# Patient Record
Sex: Female | Born: 1995 | Race: Black or African American | Hispanic: No | Marital: Single | Smoking: Never smoker
Health system: Southern US, Community
[De-identification: ages and names within clinical notes are randomized; demographics above are authoritative.]

## PROBLEM LIST (undated history)

## (undated) ENCOUNTER — Inpatient Hospital Stay (HOSPITAL_COMMUNITY): Payer: Self-pay

## (undated) DIAGNOSIS — R55 Syncope and collapse: Secondary | ICD-10-CM

## (undated) DIAGNOSIS — Z789 Other specified health status: Secondary | ICD-10-CM

## (undated) DIAGNOSIS — Z8619 Personal history of other infectious and parasitic diseases: Secondary | ICD-10-CM

## (undated) HISTORY — DX: Syncope and collapse: R55

## (undated) HISTORY — PX: INDUCED ABORTION: SHX677

## (undated) HISTORY — PX: NO PAST SURGERIES: SHX2092

## (undated) HISTORY — DX: Personal history of other infectious and parasitic diseases: Z86.19

---
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
## 2014-08-02 ENCOUNTER — Encounter (HOSPITAL_COMMUNITY): Payer: Self-pay

## 2014-08-02 ENCOUNTER — Inpatient Hospital Stay (HOSPITAL_COMMUNITY)
Admission: AD | Admit: 2014-08-02 | Discharge: 2014-08-02 | Disposition: A | Discharge status: Home / Self Care | Payer: Commercial Indemnity | Source: Ambulatory Visit | Attending: Obstetrics & Gynecology | Admitting: Obstetrics & Gynecology

## 2014-08-02 DIAGNOSIS — F17200 Nicotine dependence, unspecified, uncomplicated: Secondary | ICD-10-CM | POA: Diagnosis not present

## 2014-08-02 DIAGNOSIS — R52 Pain, unspecified: Secondary | ICD-10-CM

## 2014-08-02 DIAGNOSIS — R1033 Periumbilical pain: Secondary | ICD-10-CM | POA: Insufficient documentation

## 2014-08-02 DIAGNOSIS — R109 Unspecified abdominal pain: Secondary | ICD-10-CM

## 2014-08-02 LAB — URINE MICROSCOPIC-ADD ON

## 2014-08-02 LAB — URINALYSIS, ROUTINE W REFLEX MICROSCOPIC
Bilirubin Urine: NEGATIVE
GLUCOSE, UA: NEGATIVE mg/dL
KETONES UR: NEGATIVE mg/dL
LEUKOCYTES UA: NEGATIVE
NITRITE: NEGATIVE
PROTEIN: NEGATIVE mg/dL
Specific Gravity, Urine: 1.01 (ref 1.005–1.030)
UROBILINOGEN UA: 4 mg/dL — AB (ref 0.0–1.0)
pH: 7 (ref 5.0–8.0)

## 2014-08-02 LAB — POCT PREGNANCY, URINE: PREG TEST UR: NEGATIVE

## 2014-08-02 NOTE — MAU Provider Note (Signed)
History     CSN: [id number]  Arrival date and time: 08/02/14 2234   First Provider Initiated Contact with Patient 08/02/14 2346      Chief Complaint  Patient presents with  . Abdominal Pain   Abdominal Pain    Sarah Quinn is a 18 y.o. a G1P1001 who presents with periumbilical abdominal pain. She states that she had the pain while she was at work, but it has since gone. She states that she took a benadryl, but has not taken anything else. She refuses any pain medication today. She states that she would like to go home since the pain has subsided.   Past Medical History  Diagnosis Date  . Medical history non-contributory     Past Surgical History  Procedure Laterality Date  . No past surgeries      History reviewed. No pertinent family history.  History  Substance Use Topics  . Smoking status: Current Every Day Smoker -- 1.00 packs/day for 1 years    Types: Cigarettes  . Smokeless tobacco: Never Used  . Alcohol Use: No    Allergies: No Known Allergies  Prescriptions prior to admission  Medication Sig Dispense Refill  . diphenhydrAMINE (BENADRYL) 25 MG tablet Take 25 mg by mouth every 6 (six) hours as needed.        Review of Systems  Gastrointestinal: Positive for abdominal pain.   Physical Exam   Blood pressure 117/67, pulse 64, temperature 99.2 F (37.3 C), temperature source Oral, resp. rate 16, height 5\' 5"  (1.651 m), weight 58.605 kg (129 lb 3.2 oz), last menstrual period 07/20/2014.  Physical Exam  Nursing note and vitals reviewed. Constitutional: She is oriented to person, place, and time. She appears well-developed and well-nourished. No distress.  GI: Soft.  Neurological: She is alert and oriented to person, place, and time.  Skin: Skin is warm and dry.  Psychiatric: She has a normal mood and affect.    MAU Course  Procedures Results for orders placed during the hospital encounter of 08/02/14 (from the past 24 hour(s))  URINALYSIS,  ROUTINE W REFLEX MICROSCOPIC     Status: Abnormal   Collection Time    08/02/14 11:01 PM      Result Value Ref Range   Color, Urine YELLOW  YELLOW   APPearance CLEAR  CLEAR   Specific Gravity, Urine 1.010  1.005 - 1.030   pH 7.0  5.0 - 8.0   Glucose, UA NEGATIVE  NEGATIVE mg/dL   Hgb urine dipstick TRACE (*) NEGATIVE   Bilirubin Urine NEGATIVE  NEGATIVE   Ketones, ur NEGATIVE  NEGATIVE mg/dL   Protein, ur NEGATIVE  NEGATIVE mg/dL   Urobilinogen, UA 4.0 (*) 0.0 - 1.0 mg/dL   Nitrite NEGATIVE  NEGATIVE   Leukocytes, UA NEGATIVE  NEGATIVE  URINE MICROSCOPIC-ADD ON     Status: Abnormal   Collection Time    08/02/14 11:01 PM      Result Value Ref Range   Squamous Epithelial / LPF RARE  RARE   WBC, UA 0-2  <3 WBC/hpf   RBC / HPF 3-6  <3 RBC/hpf   Bacteria, UA FEW (*) RARE  POCT PREGNANCY, URINE     Status: None   Collection Time    08/02/14 11:13 PM      Result Value Ref Range   Preg Test, Ur NEGATIVE  NEGATIVE   Assessment and Plan   1. Abdominal pain, acute    Follow-up Information   [facility] (If symptoms worsen)    Specialty:  Emergency Medicine   Contact information:   [address] [place] [id number] [place] [address] [phone]       Tawnya Crook 08/02/2014, 11:49 PM

## 2014-08-02 NOTE — Discharge Instructions (Signed)

## 2014-08-02 NOTE — MAU Note (Signed)
Lower abdominal cramping since this morning. nexplanon in place since November 2014. Denies vaginal bleeding or vaginal discharge. Denies urinary complaints. Some nausea, no vomiting/diarrhea/constipation.

## 2014-10-30 ENCOUNTER — Encounter (HOSPITAL_COMMUNITY): Payer: Self-pay

## 2015-07-07 ENCOUNTER — Encounter (HOSPITAL_COMMUNITY): Payer: Self-pay

## 2015-07-07 ENCOUNTER — Emergency Department (HOSPITAL_COMMUNITY)
Admission: EM | Admit: 2015-07-07 | Discharge: 2015-07-07 | Disposition: A | Discharge status: Home / Self Care | Payer: Commercial Indemnity | Attending: Emergency Medicine | Admitting: Emergency Medicine

## 2015-07-07 DIAGNOSIS — Z720 Tobacco use: Secondary | ICD-10-CM | POA: Diagnosis not present

## 2015-07-07 DIAGNOSIS — R238 Other skin changes: Secondary | ICD-10-CM

## 2015-07-07 DIAGNOSIS — R21 Rash and other nonspecific skin eruption: Secondary | ICD-10-CM | POA: Diagnosis present

## 2015-07-07 NOTE — ED Notes (Signed)
Pt had her birth control in her arm replaced over 2 years ago but recently noticed a bump that has come up on the inside of her left arm where they placed it and it itches occasionally and the bump swells up from time to time. Wants to get it checked out.

## 2015-07-07 NOTE — Discharge Instructions (Signed)
No signs of infection or allergic reaction on exam today.  It is recommended that you follow up with the physician that implanted the Implanon.  It may need to be removed.  Return to the Emergency Department if you notice redness or warmth at the site, fever above 101 F, or swelling of the lips, tongue, or throat.

## 2015-07-07 NOTE — ED Provider Notes (Signed)
History  This chart was scribed for non-physician practitioner, Santiago Glad, PA-C,working with Lorre Nick, MD, by Karle Plumber, ED Scribe. This patient was seen in room TR07C/TR07C and the patient's care was started at 4:27 PM.  Chief Complaint  Patient presents with  . Rash   The history is provided by the patient and medical records. No language interpreter was used.    HPI Comments:  Sarah Quinn is a 19 y.o. female who presents to the Emergency Department complaining of a nodule to the LUE that appeared earlier today. She states the nodule appeared next to the site where her birth control implant is (placed 2 years ago) and reports associated soreness, itching and swelling of the area. She states she has been having issues around the area intermittently since she received the implant. She has not done anything to treat the symptoms. She denies alleviating factors. She denies fever, chills, red streaking, nausea or vomiting.  Past Medical History  Diagnosis Date  . Medical history non-contributory    Past Surgical History  Procedure Laterality Date  . No past surgeries     No family history on file. History  Substance Use Topics  . Smoking status: Current Every Day Smoker -- 1.00 packs/day for 1 years    Types: Cigarettes  . Smokeless tobacco: Never Used  . Alcohol Use: No   OB History    Gravida Para Term Preterm AB TAB SAB Ectopic Multiple Living   0 0 0 0 0 0 1     Review of Systems  Constitutional: Negative for fever and chills.  Gastrointestinal: Negative for nausea and vomiting.  Skin: Positive for rash (nodule). Negative for color change.    Allergies  Review of patient's allergies indicates no known allergies.  Home Medications   Prior to Admission medications   Medication Sig Start Date End Date Taking? Authorizing Provider  diphenhydrAMINE (BENADRYL) 25 MG tablet Take 25 mg by mouth every 6 (six) hours as needed.    Historical  Provider, MD   Triage Vitals: BP 118/78 mmHg  Pulse 72  Temp(Src) 98.1 F (36.7 C) (Oral)  Resp 14  SpO2 95%  LMP  Physical Exam  Constitutional: She is oriented to person, place, and time. She appears well-developed and well-nourished.  HENT:  Head: Normocephalic and atraumatic.  Eyes: EOM are normal.  Neck: Normal range of motion.  Cardiovascular: Normal rate, regular rhythm and normal heart sounds.  Exam reveals no gallop and no friction rub.   No murmur heard. Pulmonary/Chest: Effort normal and breath sounds normal. No respiratory distress. She has no wheezes. She has no rales.  Musculoskeletal: Normal range of motion.  Neurological: She is alert and oriented to person, place, and time.  Skin: Skin is warm and dry. No erythema.  Small 1-2 mm skin colored raised area to LUE. No surrounding erythema, edema or warmth. No other rash. No signs of infection.  Psychiatric: She has a normal mood and affect. Her behavior is normal.  Nursing note and vitals reviewed.   ED Course  Procedures (including critical care time) DIAGNOSTIC STUDIES: Oxygen Saturation is 95% on RA, adequate by my interpretation.   COORDINATION OF CARE: 4:32 PM- Reassured pt that the area did not appear infected. Encouraged to follow up with the doctor that inserted the Implanon. Pt verbalizes understanding and agrees to plan.  Medications - No data to display  Labs Review Labs Reviewed - No data to display  Imaging Review No results  found.   EKG Interpretation None      MDM   Final diagnoses:  Papule of skin   Patient presents today with a small raised area of the left upper arm at the site of her implanon inplant.  No signs of infection.  No rash present.  Patient instructed to follow up with the physician that implanted the Implanon.  Stable for discharge.  Return precautions given.  I personally performed the services described in this documentation, which was scribed in my presence. The  recorded information has been reviewed and is accurate.    Santiago GladHeather Erdem Naas, PA-C 07/07/15 1926  Lorre NickAnthony Allen, MD 07/08/15 [phone]

## 2016-10-08 ENCOUNTER — Emergency Department (HOSPITAL_COMMUNITY)
Admission: EM | Admit: 2016-10-08 | Discharge: 2016-10-08 | Disposition: A | Discharge status: Home / Self Care | Payer: Commercial Indemnity | Attending: Emergency Medicine | Admitting: Emergency Medicine

## 2016-10-08 ENCOUNTER — Encounter (HOSPITAL_COMMUNITY): Payer: Self-pay

## 2016-10-08 DIAGNOSIS — N920 Excessive and frequent menstruation with regular cycle: Secondary | ICD-10-CM | POA: Diagnosis not present

## 2016-10-08 DIAGNOSIS — N939 Abnormal uterine and vaginal bleeding, unspecified: Secondary | ICD-10-CM | POA: Diagnosis present

## 2016-10-08 DIAGNOSIS — N390 Urinary tract infection, site not specified: Secondary | ICD-10-CM

## 2016-10-08 DIAGNOSIS — F17210 Nicotine dependence, cigarettes, uncomplicated: Secondary | ICD-10-CM | POA: Diagnosis not present

## 2016-10-08 LAB — URINALYSIS, ROUTINE W REFLEX MICROSCOPIC
Bilirubin Urine: NEGATIVE
GLUCOSE, UA: NEGATIVE mg/dL
KETONES UR: NEGATIVE mg/dL
Nitrite: POSITIVE — AB
PH: 7.5 (ref 5.0–8.0)
Protein, ur: NEGATIVE mg/dL
SPECIFIC GRAVITY, URINE: 1.025 (ref 1.005–1.030)

## 2016-10-08 LAB — URINE MICROSCOPIC-ADD ON

## 2016-10-08 LAB — WET PREP, GENITAL
CLUE CELLS WET PREP: NONE SEEN
SPERM: NONE SEEN
TRICH WET PREP: NONE SEEN
Yeast Wet Prep HPF POC: NONE SEEN

## 2016-10-08 LAB — PREGNANCY, URINE: Preg Test, Ur: NEGATIVE

## 2016-10-08 MED ORDER — ACETAMINOPHEN 325 MG PO TABS
650.0000 mg | ORAL_TABLET | Freq: Once | ORAL | Status: AC
  Administered 2016-10-08: 650 mg via ORAL
  Filled 2016-10-08: qty 2

## 2016-10-08 MED ORDER — NITROFURANTOIN MONOHYD MACRO 100 MG PO CAPS: 100.0000 mg | ORAL_CAPSULE | Freq: Two times a day (BID) | ORAL | 0 refills | Status: DC

## 2016-10-08 MED ORDER — KETOROLAC TROMETHAMINE 30 MG/ML IJ SOLN
30.0000 mg | Freq: Once | INTRAMUSCULAR | Status: AC
  Administered 2016-10-08: 30 mg via INTRAMUSCULAR
  Filled 2016-10-08: qty 1

## 2016-10-08 MED ORDER — NAPROXEN 500 MG PO TABS: 500.0000 mg | ORAL_TABLET | Freq: Two times a day (BID) | ORAL | 0 refills | Status: DC

## 2016-10-08 NOTE — ED Triage Notes (Signed)
She states Her period began yesterday and was normal. She states that today her bleeding has become "a lot" and she is having some period cramps. She states she has an Implanon implant that was implanted over three years ago.  She is in no distress.

## 2016-10-08 NOTE — ED Notes (Signed)
No respiratory or acute distress noted alert and oriented x 3 call light in reach. 

## 2016-10-08 NOTE — ED Provider Notes (Signed)
[facility]-EMERGENCY DEPT Provider Note   CSN: [id number] Arrival date & time: 10/08/16  1357     History   Chief Complaint Chief Complaint  Patient presents with  . Vaginal Bleeding    HPI Sarah Quinn is a 20 y.o. female.  HPI Sarah Quinn is a 20 y.o. female presents to ED with abdominal pain and vaginal bleeding. Patient states that she started her menstrual cycle yesterday. She states that it was appropriate time for her to start. It was normal yesterday, however today she developed increased bleeding and cramping. Patient states that she is passing large clots and has to change her pad every half an hour. Patient states that she sat on the toilet and states "blood was just coming out." She does have an Implanon, states she has had it for 3 years, and states that it was supposed to be taken out. She denies any urinary symptoms. Unsure if she is pregnant. Denies any back pain. Denies dizziness or lightheadedness. No other complaints.  Past Medical History:  Diagnosis Date  . Medical history non-contributory     Patient Active Problem List   Diagnosis Date Noted  . SCOLIOSIS 08/31/2007    Past Surgical History:  Procedure Laterality Date  . NO PAST SURGERIES      OB History    Gravida Para Term Preterm AB Living   1 1 1  0 0 1   SAB TAB Ectopic Multiple Live Births   0 0 0 0 1       Home Medications    Prior to Admission medications   Medication Sig Start Date End Date Taking? Authorizing Provider  levonorgestrel (MIRENA) 20 MCG/24HR IUD 1 each by Intrauterine route once. Placed 07/2013   Yes Historical Provider, MD    Family History No family history on file.  Social History Social History  Substance Use Topics  . Smoking status: Current Every Day Smoker    Packs/day: 1.00    Years: 1.00    Types: Cigarettes  . Smokeless tobacco: Never Used  . Alcohol use No     Allergies   Review of patient's allergies indicates no known  allergies.   Review of Systems Review of Systems  Constitutional: Negative for chills and fever.  Respiratory: Negative for cough, chest tightness and shortness of breath.   Cardiovascular: Negative for chest pain, palpitations and leg swelling.  Gastrointestinal: Positive for abdominal pain. Negative for diarrhea, nausea and vomiting.  Genitourinary: Positive for pelvic pain and vaginal bleeding. Negative for dysuria, flank pain, vaginal discharge and vaginal pain.  Musculoskeletal: Negative for arthralgias, myalgias, neck pain and neck stiffness.  Skin: Negative for rash.  Neurological: Negative for dizziness, weakness and headaches.  All other systems reviewed and are negative.    Physical Exam Updated Vital Signs BP 111/81 (BP Location: Right Arm)   Pulse 70   Temp 98.4 F (36.9 C) (Oral)   Resp 18   Ht 5\' 6"  (1.676 m)   Wt 60.2 kg   LMP 10/07/2016 (Exact Date)   SpO2 96%   BMI 21.43 kg/m   Physical Exam  Constitutional: She appears well-developed and well-nourished. No distress.  HENT:  Head: Normocephalic.  Eyes: Conjunctivae are normal.  Neck: Neck supple.  Cardiovascular: Normal rate, regular rhythm and normal heart sounds.   Pulmonary/Chest: Effort normal and breath sounds normal. No respiratory distress. She has no wheezes. She has no rales.  Abdominal: Soft. Bowel sounds are normal. She exhibits no distension. There is tenderness.  There is no rebound.  Suprapubic tenderness  Genitourinary:  Genitourinary Comments: Normal external genitalia. Normal vaginal canal. Moderate blood, no clots in vaginal canal. Cervix is normal, closed. No CMT. No uterine or adnexal tenderness. No masses palpated.    Musculoskeletal: She exhibits no edema.  Neurological: She is alert.  Skin: Skin is warm and dry.  Psychiatric: She has a normal mood and affect. Her behavior is normal.  Nursing note and vitals reviewed.    ED Treatments / Results  Labs (all labs ordered are  listed, but only abnormal results are displayed) Labs Reviewed  WET PREP, GENITAL - Abnormal; Notable for the following:       Result Value   WBC, Wet Prep HPF POC FEW (*)    All other components within normal limits  URINALYSIS, ROUTINE W REFLEX MICROSCOPIC (NOT AT [facility]) - Abnormal; Notable for the following:    APPearance TURBID (*)    Hgb urine dipstick LARGE (*)    Nitrite POSITIVE (*)    Leukocytes, UA SMALL (*)    All other components within normal limits  URINE MICROSCOPIC-ADD ON - Abnormal; Notable for the following:    Squamous Epithelial / LPF 0-5 (*)    Bacteria, UA MANY (*)    All other components within normal limits  PREGNANCY, URINE  GC/CHLAMYDIA PROBE AMP ([place]) NOT AT [facility]    EKG  EKG Interpretation None       Radiology No results found.  Procedures Procedures (including critical care time)  Medications Ordered in ED Medications - No data to display   Initial Impression / Assessment and Plan / ED Course  I have reviewed the triage vital signs and the nursing notes.  Pertinent labs & imaging results that were available during my care of the patient were reviewed by me and considered in my medical decision making (see chart for details).  Clinical Course   Patient in emergency department with vaginal bleeding and lower abdominal cramping. Symptoms started today, bleeding started yesterday. States it is normal., However pain and bleeding is heavier. Patient states her Implanon is expired. She reports similar symptoms prior to getting Implanon. Will check pelvic exam, will check pregnancy test and urinalysis.   7:43 PM Patient feels much better after Toradol and Tylenol. She is not pregnant. Her urinalysis shows many bacteria, will treat with Macrobid for 5 days. Her pelvic exam unremarkable other than some blood noted in vaginal canal, no clots. Cervix is normal. Most likely dysfunctional uterine bleeding from expired Implanon. Will have her follow  up with her OB/GYN. Naproxen for pain and inflammation.  Vital signs are normal at this time of discharge. Return precautions discussed.  Final Clinical Impressions(s) / ED Diagnoses   Final diagnoses:  Menorrhagia with regular cycle  Urinary tract infection without hematuria, site unspecified    New Prescriptions New Prescriptions   NAPROXEN (NAPROSYN) 500 MG TABLET    Take 1 tablet (500 mg total) by mouth 2 (two) times daily.   NITROFURANTOIN, MACROCRYSTAL-MONOHYDRATE, (MACROBID) 100 MG CAPSULE    Take 1 capsule (100 mg total) by mouth 2 (two) times daily.     Jaynie Crumbleatyana Sanna Porcaro, PA-C 10/08/16 1944    Jaynie Crumbleatyana Neno Hohensee, PA-C 10/08/16 1945    Melene Planan Floyd, DO 10/08/16 2002

## 2016-10-08 NOTE — Discharge Instructions (Signed)
Take naproxen as prescribed for pain and to slow down the flow. Try heating pads. You can take Tylenol in addition for pain relief. Please follow up with OB/GYN doctor for renewal of your birth control. Take macrobid for UTI until all gone.

## 2016-10-09 LAB — GC/CHLAMYDIA PROBE AMP (~~LOC~~) NOT AT ARMC
CHLAMYDIA, DNA PROBE: POSITIVE — AB
Neisseria Gonorrhea: POSITIVE — AB

## 2016-10-11 ENCOUNTER — Telehealth (HOSPITAL_BASED_OUTPATIENT_CLINIC_OR_DEPARTMENT_OTHER): Payer: Self-pay

## 2017-01-24 ENCOUNTER — Encounter (HOSPITAL_COMMUNITY): Payer: Self-pay

## 2017-01-24 ENCOUNTER — Emergency Department (HOSPITAL_COMMUNITY)
Admission: EM | Admit: 2017-01-24 | Discharge: 2017-01-24 | Disposition: A | Discharge status: Home / Self Care | Payer: Managed Care, Other (non HMO) | Attending: Emergency Medicine | Admitting: Emergency Medicine

## 2017-01-24 DIAGNOSIS — F17210 Nicotine dependence, cigarettes, uncomplicated: Secondary | ICD-10-CM | POA: Insufficient documentation

## 2017-01-24 DIAGNOSIS — R197 Diarrhea, unspecified: Secondary | ICD-10-CM | POA: Diagnosis not present

## 2017-01-24 DIAGNOSIS — R109 Unspecified abdominal pain: Secondary | ICD-10-CM | POA: Diagnosis present

## 2017-01-24 LAB — URINALYSIS, ROUTINE W REFLEX MICROSCOPIC
Bilirubin Urine: NEGATIVE
GLUCOSE, UA: NEGATIVE mg/dL
Hgb urine dipstick: NEGATIVE
Ketones, ur: NEGATIVE mg/dL
Nitrite: NEGATIVE
Protein, ur: NEGATIVE mg/dL
Specific Gravity, Urine: 1.018 (ref 1.005–1.030)
pH: 6 (ref 5.0–8.0)

## 2017-01-24 LAB — COMPREHENSIVE METABOLIC PANEL
ALK PHOS: 33 U/L — AB (ref 38–126)
ALT: 20 U/L (ref 14–54)
AST: 27 U/L (ref 15–41)
Albumin: 4 g/dL (ref 3.5–5.0)
Anion gap: 9 (ref 5–15)
BUN: 13 mg/dL (ref 6–20)
CALCIUM: 9.1 mg/dL (ref 8.9–10.3)
CO2: 23 mmol/L (ref 22–32)
Chloride: 106 mmol/L (ref 101–111)
Creatinine, Ser: 0.87 mg/dL (ref 0.44–1.00)
GFR calc Af Amer: >60 mL/min (ref >60)
GFR calc non Af Amer: >60 mL/min (ref >60)
Glucose, Bld: 92 mg/dL (ref 65–99)
Potassium: 3.8 mmol/L (ref 3.5–5.1)
SODIUM: 138 mmol/L (ref 135–145)
Total Bilirubin: 0.8 mg/dL (ref 0.3–1.2)
Total Protein: 6.9 g/dL (ref 6.5–8.1)

## 2017-01-24 LAB — CBC
HCT: 43.5 % (ref 36.0–46.0)
HEMOGLOBIN: 14.7 g/dL (ref 12.0–15.0)
MCH: 30.9 pg (ref 26.0–34.0)
MCHC: 33.8 g/dL (ref 30.0–36.0)
MCV: 91.4 fL (ref 78.0–100.0)
Platelets: 252 10*3/uL (ref 150–400)
RBC: 4.76 MIL/uL (ref 3.87–5.11)
RDW: 12.6 % (ref 11.5–15.5)
WBC: 7.4 10*3/uL (ref 4.0–10.5)

## 2017-01-24 LAB — I-STAT BETA HCG BLOOD, ED (MC, WL, AP ONLY): I-stat hCG, quantitative: <5 m[IU]/mL (ref <5)

## 2017-01-24 LAB — LIPASE, BLOOD: Lipase: 16 U/L (ref 11–51)

## 2017-01-24 MED ORDER — SODIUM CHLORIDE 0.9 % IV BOLUS (SEPSIS)
1000.0000 mL | Freq: Once | INTRAVENOUS | Status: AC
  Administered 2017-01-24: 1000 mL via INTRAVENOUS

## 2017-01-24 MED ORDER — ONDANSETRON HCL 4 MG/2ML IJ SOLN
4.0000 mg | Freq: Once | INTRAMUSCULAR | Status: AC
  Administered 2017-01-24: 4 mg via INTRAVENOUS
  Filled 2017-01-24: qty 2

## 2017-01-24 NOTE — ED Notes (Signed)
Pt feels much better and wishes to go home.

## 2017-01-24 NOTE — ED Provider Notes (Signed)
[facility]-EMERGENCY DEPT Provider Note  CSN: [id number] Arrival date & time: 01/24/17  1404  History   Chief Complaint Chief Complaint  Patient presents with  . Abdominal Pain  . Emesis   HPI Sarah Quinn is a 21 y.o. female.  The history is provided by the patient and medical records. No language interpreter was used.  Illness  This is a new problem. The current episode started 2 days ago. The problem occurs constantly. The problem has been gradually worsening. Associated symptoms include abdominal pain (cramping). Pertinent negatives include no chest pain, no headaches and no shortness of breath. The symptoms are aggravated by eating. Relieved by: bowel movements.    Past Medical History:  Diagnosis Date  . Medical history non-contributory    Patient Active Problem List   Diagnosis Date Noted  . SCOLIOSIS 08/31/2007   Past Surgical History:  Procedure Laterality Date  . NO PAST SURGERIES     OB History    Gravida Para Term Preterm AB Living   1 1 1  0 0 1   SAB TAB Ectopic Multiple Live Births   0 0 0 0 1      Home Medications    Prior to Admission medications   Medication Sig Start Date End Date Taking? Authorizing Provider  etonogestrel (NEXPLANON) 68 MG IMPL implant 1 each by Subdermal route once.   Yes Historical Provider, MD   Family History No family history on file.  Social History Social History  Substance Use Topics  . Smoking status: Current Every Day Smoker    Packs/day: 1.00    Years: 1.00    Types: Cigarettes  . Smokeless tobacco: Never Used  . Alcohol use No    Allergies   Patient has no known allergies.  Review of Systems Review of Systems  Constitutional: Positive for appetite change (decreased) and chills.  Respiratory: Negative for cough and shortness of breath.   Cardiovascular: Negative for chest pain.  Gastrointestinal: Positive for abdominal pain (cramping), diarrhea and nausea.  Genitourinary: Negative for dysuria,  frequency, hematuria, urgency, vaginal bleeding and vaginal discharge.  Neurological: Negative for headaches.  All other systems reviewed and are negative.   Physical Exam Updated Vital Signs BP 116/78 (BP Location: Right Arm)   Pulse 92   Temp 98.2 F (36.8 C) (Oral)   Resp 18   Ht 5\' 3"  (1.6 m)   Wt 63.6 kg   SpO2 100%   BMI 24.82 kg/m   Physical Exam  Constitutional: She is oriented to person, place, and time. She appears well-developed and well-nourished. No distress.  HENT:  Head: Normocephalic and atraumatic.  Eyes: EOM are normal. Pupils are equal, round, and reactive to light.  Neck: Normal range of motion. Neck supple.  Cardiovascular: Regular rhythm and normal heart sounds.  Tachycardia present.   Pulmonary/Chest: Effort normal and breath sounds normal.  Abdominal: Soft. Bowel sounds are normal. She exhibits no distension. There is no tenderness.  Musculoskeletal: Normal range of motion.  Neurological: She is alert and oriented to person, place, and time.  Skin: Skin is warm and dry. Capillary refill takes less than 2 seconds. She is not diaphoretic.  Nursing note and vitals reviewed.   ED Treatments / Results  Labs (all labs ordered are listed, but only abnormal results are displayed) Labs Reviewed  COMPREHENSIVE METABOLIC PANEL - Abnormal; Notable for the following:       Result Value   Alkaline Phosphatase 33 (*)    All other components within  normal limits  URINALYSIS, ROUTINE W REFLEX MICROSCOPIC - Abnormal; Notable for the following:    APPearance HAZY (*)    Leukocytes, UA SMALL (*)    Bacteria, UA RARE (*)    Squamous Epithelial / LPF 6-30 (*)    All other components within normal limits  LIPASE, BLOOD  CBC  I-STAT BETA HCG BLOOD, ED ([facility], [facility], [facility] ONLY)   EKG  EKG Interpretation None      Radiology No results found.  Procedures Procedures (including critical care time)  Medications Ordered in ED Medications  sodium chloride 0.9 % bolus  1,000 mL (0 mLs Intravenous Stopped 01/24/17 2027)  ondansetron (ZOFRAN) injection 4 mg (4 mg Intravenous Given 01/24/17 1854)    Initial Impression / Assessment and Plan / ED Course  I have reviewed the triage vital signs and the nursing notes.  21 y.o. female with above stated PMHx, HPI, and physical. Symptom onset over past 24-48 hours. Nausea, decreased appetite, watery nonbloody diarrhea. No suspicious food intake. Patient denies recent travel or antibiotics. Patient is otherwise healthy with no medical problems.  Heart rate and low 100s but normotensive. Urinalysis showing no evidence of infection or severe dehydration. CBC without leukocytosis or acute anemia. CMP and lipase unremarkable. Serum beta-hCG not detectable. Patient given IV fluids and Zofran following which she is feeling better. The source is viral gastroenteritis.  Laboratory and imaging results were personally reviewed by myself and used in the medical decision making of this patient's treatment and disposition.  Pt discharged home in stable condition. Strict ED return precautions dicussed. Pt understands and agrees with the plan and has no further questions or concerns.   Pt care discussed with and followed by my attending, Dr. Gerhard Munch  Angelina Ok, MD Pager [phone]  Final Clinical Impressions(s) / ED Diagnoses   Final diagnoses:  Diarrhea in adult patient   New Prescriptions New Prescriptions   No medications on file     Angelina Ok, MD 01/24/17 9604    Gerhard Munch, MD 01/27/17 1530

## 2017-01-24 NOTE — ED Triage Notes (Signed)
Patient complains of abdominal pain with vomiting and diarrhea that started last night. Has friend with same. denies fever, no chills. Alert and oriented, NAD

## 2017-04-10 ENCOUNTER — Encounter (HOSPITAL_COMMUNITY): Payer: Self-pay | Admitting: *Deleted

## 2017-04-10 ENCOUNTER — Emergency Department (HOSPITAL_COMMUNITY)
Admission: EM | Admit: 2017-04-10 | Discharge: 2017-04-10 | Disposition: A | Discharge status: Home / Self Care | Payer: Managed Care, Other (non HMO) | Attending: Emergency Medicine | Admitting: Emergency Medicine

## 2017-04-10 DIAGNOSIS — F17210 Nicotine dependence, cigarettes, uncomplicated: Secondary | ICD-10-CM | POA: Diagnosis not present

## 2017-04-10 DIAGNOSIS — B009 Herpesviral infection, unspecified: Secondary | ICD-10-CM | POA: Insufficient documentation

## 2017-04-10 DIAGNOSIS — M79602 Pain in left arm: Secondary | ICD-10-CM | POA: Diagnosis present

## 2017-04-10 MED ORDER — TRAMADOL HCL 50 MG PO TABS: 50.0000 mg | ORAL_TABLET | Freq: Four times a day (QID) | ORAL | 0 refills | Status: DC | PRN

## 2017-04-10 MED ORDER — VALACYCLOVIR HCL 1 G PO TABS: 1000.0000 mg | ORAL_TABLET | Freq: Three times a day (TID) | ORAL | 0 refills | Status: DC

## 2017-04-10 NOTE — Discharge Instructions (Signed)
Return here as needed. Follow up with the [facility]

## 2017-04-10 NOTE — ED Provider Notes (Signed)
[facility]-EMERGENCY DEPT Provider Note   CSN: [id number] Arrival date & time: 04/10/17  1926  By signing my name below, I, [name], attest that this documentation has been prepared under the direction and in the presence of [employer], PA-C. Electronically Signed: [name], Scribe. 04/10/2017. 9:39 PM.  History   Chief Complaint Chief Complaint  Patient presents with  . Arm Pain   The history is provided by the patient and medical records. No language interpreter was used.    HPI Comments:  Sarah Quinn is a 21 y.o. female with no pertinent PMHx, who presents to the Emergency Department complaining of intermittent, acutely worsening, itching/ burning in her left arm onset three years ago. Pt reports for three years, she has been having pain/itching and burning in the left arm s/p an Implanon contraceptive device placement. She states upon waking up this morning, the area around the site was red with some swelling and worsened burning and pain beyond baseline. She states she has had a similar problem with the [facility] prior to today, with similar burning three years ago leading to the removal of the contraceptive and reinsertion to the current site. Pt denies rash, CP, back pain, and any other complaints at this time. PT is a current every day smoker.   Past Medical History:  Diagnosis Date  . Medical history non-contributory    Patient Active Problem List   Diagnosis Date Noted  . SCOLIOSIS 08/31/2007   Past Surgical History:  Procedure Laterality Date  . NO PAST SURGERIES      OB History    Gravida Para Term Preterm AB Living   0 0 1   SAB TAB Ectopic Multiple Live Births   0 0 0 0 1      Home Medications    Prior to Admission medications   Medication Sig Start Date End Date Taking? Authorizing Provider  etonogestrel (NEXPLANON) 68 MG IMPL implant 1 each by Subdermal route once.    Historical Provider, MD    Family History No family history  on file.  Social History Social History  Substance Use Topics  . Smoking status: Current Every Day Smoker    Packs/day: 1.00    Years: 1.00    Types: Cigarettes  . Smokeless tobacco: Never Used  . Alcohol use Yes     Allergies   Patient has no known allergies.   Review of Systems Review of Systems All systems reviewed and are negative for acute change except as noted in the HPI.    Physical Exam Updated Vital Signs BP 96/83 (BP Location: Right Arm) Comment (BP Location): refuse left arm use   Pulse 89   Temp 98.3 F (36.8 C) (Oral)   Resp 20   Ht  (1.676 m)   Wt 140 lb (63.5 kg)   SpO2 97%   BMI 22.60 kg/m   Physical Exam  Constitutional: She is oriented to person, place, and time. She appears well-developed and well-nourished. No distress.  HENT:  Head: Normocephalic and atraumatic.  Eyes: Pupils are equal, round, and reactive to light.  Pulmonary/Chest: Effort normal.  Neurological: She is alert and oriented to person, place, and time.  Skin: Skin is warm and dry.  Two vesicles on the medial aspect of the left mid bicep region with surrounding erythema. Palpable pain around the area. Vesicles intact.   Psychiatric: She has a normal mood and affect.  Nursing note and vitals reviewed.  ED Treatments / Results  DIAGNOSTIC STUDIES:  Oxygen Saturation is 100% on RA, normal by my interpretation.    COORDINATION OF CARE:  9:39 PM Discussed treatment plan with pt at bedside including Valtrex and Tramadol and pt agreed to plan.  Labs (all labs ordered are listed, but only abnormal results are displayed) Labs Reviewed - No data to display  EKG  EKG Interpretation None       Radiology No results found.  Procedures Procedures (including critical care time)  Medications Ordered in ED Medications - No data to display   Initial Impression / Assessment and Plan / ED Course  I have reviewed the triage vital signs and the nursing  notes.  Pertinent labs & imaging results that were available during my care of the patient were reviewed by me and considered in my medical decision making (see chart for details).     Patient has what appears to be a herpetic type rash. The area is very small. Will treat with Valtrex  Final Clinical Impressions(s) / ED Diagnoses   Final diagnoses:  None    New Prescriptions New Prescriptions   No medications on file   I personally performed the services described in this documentation, which was scribed in my presence. The recorded information has been reviewed and is accurate.     Charlestine Night, PA-C 04/21/17 4098    Lyndal Pulley, MD 04/21/17 [phone]

## 2017-04-10 NOTE — ED Triage Notes (Signed)
Pt reports for 2 years she has been having pain/itching and burning in the left arm where her Implanon was placed. No meds prior to arrival. Denies swelling.

## 2017-04-21 ENCOUNTER — Telehealth (HOSPITAL_COMMUNITY): Payer: Self-pay | Admitting: Emergency Medicine

## 2017-04-21 NOTE — Telephone Encounter (Signed)
-----   Message from Lyndal Pulley, MD sent at 04/18/2017  2:08 PM EDT ----- Regarding: leftover chart I don't know if this fell out of your inbox somehow. Forde Radon have it done by Thursday when I leave.

## 2017-08-08 ENCOUNTER — Encounter (HOSPITAL_COMMUNITY): Payer: Self-pay | Admitting: Emergency Medicine

## 2017-08-08 ENCOUNTER — Emergency Department (HOSPITAL_COMMUNITY)
Admission: EM | Admit: 2017-08-08 | Discharge: 2017-08-08 | Disposition: A | Discharge status: Home / Self Care | Payer: Managed Care, Other (non HMO) | Attending: Emergency Medicine | Admitting: Emergency Medicine

## 2017-08-08 DIAGNOSIS — F17210 Nicotine dependence, cigarettes, uncomplicated: Secondary | ICD-10-CM | POA: Insufficient documentation

## 2017-08-08 DIAGNOSIS — M545 Low back pain, unspecified: Secondary | ICD-10-CM

## 2017-08-08 DIAGNOSIS — Y92019 Unspecified place in single-family (private) house as the place of occurrence of the external cause: Secondary | ICD-10-CM | POA: Diagnosis not present

## 2017-08-08 DIAGNOSIS — Y999 Unspecified external cause status: Secondary | ICD-10-CM | POA: Diagnosis not present

## 2017-08-08 DIAGNOSIS — Y939 Activity, unspecified: Secondary | ICD-10-CM | POA: Insufficient documentation

## 2017-08-08 MED ORDER — DICLOFENAC SODIUM 75 MG PO TBEC: 75.0000 mg | DELAYED_RELEASE_TABLET | Freq: Two times a day (BID) | ORAL | 0 refills | Status: DC

## 2017-08-08 MED ORDER — METHOCARBAMOL 500 MG PO TABS: 500.0000 mg | ORAL_TABLET | Freq: Two times a day (BID) | ORAL | 0 refills | Status: DC
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
## 2017-08-08 NOTE — Discharge Instructions (Signed)
See your Physicain this week for recheck if pain persist

## 2017-11-10 ENCOUNTER — Other Ambulatory Visit: Payer: Self-pay | Admitting: Obstetrics & Gynecology

## 2017-12-15 ENCOUNTER — Other Ambulatory Visit: Payer: Self-pay

## 2017-12-15 ENCOUNTER — Encounter (HOSPITAL_COMMUNITY): Payer: Self-pay | Admitting: Emergency Medicine

## 2017-12-15 ENCOUNTER — Emergency Department (HOSPITAL_COMMUNITY): Payer: Managed Care, Other (non HMO)

## 2017-12-15 DIAGNOSIS — Z79899 Other long term (current) drug therapy: Secondary | ICD-10-CM | POA: Diagnosis not present

## 2017-12-15 DIAGNOSIS — R5381 Other malaise: Secondary | ICD-10-CM | POA: Insufficient documentation

## 2017-12-15 DIAGNOSIS — R55 Syncope and collapse: Secondary | ICD-10-CM | POA: Diagnosis present

## 2017-12-15 DIAGNOSIS — R5383 Other fatigue: Secondary | ICD-10-CM | POA: Diagnosis not present

## 2017-12-15 DIAGNOSIS — F17210 Nicotine dependence, cigarettes, uncomplicated: Secondary | ICD-10-CM | POA: Diagnosis not present

## 2017-12-15 DIAGNOSIS — N390 Urinary tract infection, site not specified: Secondary | ICD-10-CM | POA: Diagnosis not present

## 2017-12-15 LAB — URINALYSIS, ROUTINE W REFLEX MICROSCOPIC
Bilirubin Urine: NEGATIVE
Glucose, UA: NEGATIVE mg/dL
Ketones, ur: NEGATIVE mg/dL
Leukocytes, UA: NEGATIVE
NITRITE: POSITIVE — AB
Protein, ur: 100 mg/dL — AB
SPECIFIC GRAVITY, URINE: 1.021 (ref 1.005–1.030)
pH: 5 (ref 5.0–8.0)

## 2017-12-15 LAB — I-STAT BETA HCG BLOOD, ED (MC, WL, AP ONLY)

## 2017-12-15 LAB — CBC
HEMATOCRIT: 41.3 % (ref 36.0–46.0)
Hemoglobin: 13.8 g/dL (ref 12.0–15.0)
MCH: 30.9 pg (ref 26.0–34.0)
MCHC: 33.4 g/dL (ref 30.0–36.0)
MCV: 92.4 fL (ref 78.0–100.0)
Platelets: 324 10*3/uL (ref 150–400)
RBC: 4.47 MIL/uL (ref 3.87–5.11)
RDW: 12.8 % (ref 11.5–15.5)
WBC: 9.5 10*3/uL (ref 4.0–10.5)

## 2017-12-15 LAB — BASIC METABOLIC PANEL
Anion gap: 8 (ref 5–15)
BUN: 12 mg/dL (ref 6–20)
CALCIUM: 8.9 mg/dL (ref 8.9–10.3)
CO2: 23 mmol/L (ref 22–32)
Chloride: 106 mmol/L (ref 101–111)
Creatinine, Ser: 1.01 mg/dL — ABNORMAL HIGH (ref 0.44–1.00)
GFR calc Af Amer: >60 mL/min (ref >60)
GLUCOSE: 125 mg/dL — AB (ref 65–99)
POTASSIUM: 3.4 mmol/L — AB (ref 3.5–5.1)
Sodium: 137 mmol/L (ref 135–145)

## 2017-12-15 LAB — CBG MONITORING, ED: GLUCOSE-CAPILLARY: 119 mg/dL — AB (ref 65–99)

## 2017-12-15 MED ORDER — ONDANSETRON 4 MG PO TBDP
4.0000 mg | ORAL_TABLET | Freq: Once | ORAL | Status: AC
  Administered 2017-12-15: 4 mg via ORAL
  Filled 2017-12-15: qty 1

## 2017-12-15 NOTE — ED Triage Notes (Signed)
Pt reports she was getting her hair done and began to "not feel good... Next thing I know they were getting off the ground."  She reports chest left sided shooting chest pain, nausea and dizziness.  Pt states she did feel dehydrated so she drank a gatoraid afterward.  She reports that she was told she hit her head on the counter when she fell, but denies any head pain at this time.  No blood thinners.

## 2017-12-15 NOTE — ED Notes (Signed)
Pt transported to xray 

## 2017-12-16 ENCOUNTER — Other Ambulatory Visit: Payer: Self-pay

## 2017-12-16 ENCOUNTER — Emergency Department (HOSPITAL_COMMUNITY)
Admission: EM | Admit: 2017-12-16 | Discharge: 2017-12-16 | Disposition: A | Discharge status: Home / Self Care | Payer: Managed Care, Other (non HMO) | Attending: Emergency Medicine | Admitting: Emergency Medicine

## 2017-12-16 DIAGNOSIS — N390 Urinary tract infection, site not specified: Secondary | ICD-10-CM

## 2017-12-16 DIAGNOSIS — R5383 Other fatigue: Secondary | ICD-10-CM

## 2017-12-16 DIAGNOSIS — R55 Syncope and collapse: Secondary | ICD-10-CM

## 2017-12-16 DIAGNOSIS — R5381 Other malaise: Secondary | ICD-10-CM

## 2017-12-16 MED ORDER — CEPHALEXIN 500 MG PO CAPS: 1000.0000 mg | ORAL_CAPSULE | Freq: Two times a day (BID) | ORAL | 0 refills | Status: DC

## 2017-12-16 MED ORDER — CEPHALEXIN 250 MG PO CAPS
1000.0000 mg | ORAL_CAPSULE | Freq: Once | ORAL | Status: AC
  Administered 2017-12-16: 1000 mg via ORAL
  Filled 2017-12-16: qty 4

## 2017-12-16 MED ORDER — ONDANSETRON 4 MG PO TBDP: 4.0000 mg | ORAL_TABLET | ORAL | 0 refills | Status: DC | PRN

## 2017-12-16 NOTE — ED Notes (Signed)
ED Provider at bedside. 
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
## 2017-12-16 NOTE — Discharge Instructions (Signed)
1.  Have a recheck with your family doctor within the next 5-7 days.  If you do not have a family doctor, use the referral number in your discharge instructions to find one. 2.  Take antibiotics as prescribed. 3.  Return to the emergency department if you have recurrent symptoms or new or concerning symptoms.

## 2017-12-18 LAB — URINE CULTURE

## 2017-12-19 ENCOUNTER — Telehealth: Payer: Self-pay

## 2017-12-19 NOTE — Telephone Encounter (Signed)
Post ED Visit - Positive Culture Follow-up  Culture report reviewed by antimicrobial stewardship pharmacist:  []  Enzo BiNathan Batchelder, Pharm.D. []  Celedonio MiyamotoJeremy Frens, Pharm.D., BCPS AQ-ID []  Garvin FilaMike Maccia, Pharm.D., BCPS []  Georgina PillionElizabeth Martin, Pharm.D., BCPS []  MidlandMinh Pham, VermontPharm.D., BCPS, AAHIVP []  Estella HuskMichelle Turner, Pharm.D., BCPS, AAHIVP [x]  Lysle Pearlachel Rumbarger, PharmD, BCPS []  Casilda Carlsaylor Stone, PharmD, BCPS []  Pollyann SamplesAndy Johnston, PharmD, BCPS  Positive urine culture Treated with Cephalexin, organism sensitive to the same and no further patient follow-up is required at this time.  Jerry CarasCullom, Nasrin Lanzo Burnett 12/19/2017, 11:45 AM

## 2018-01-16 ENCOUNTER — Encounter (HOSPITAL_COMMUNITY): Payer: Self-pay | Admitting: Emergency Medicine

## 2018-01-16 ENCOUNTER — Ambulatory Visit (HOSPITAL_COMMUNITY)
Admission: EM | Admit: 2018-01-16 | Discharge: 2018-01-16 | Disposition: A | Discharge status: Home / Self Care | Payer: Managed Care, Other (non HMO) | Attending: Family Medicine | Admitting: Family Medicine

## 2018-01-16 DIAGNOSIS — R519 Headache, unspecified: Secondary | ICD-10-CM

## 2018-01-16 DIAGNOSIS — R51 Headache: Secondary | ICD-10-CM

## 2018-01-16 MED ORDER — DICLOFENAC SODIUM 75 MG PO TBEC: 75.0000 mg | DELAYED_RELEASE_TABLET | Freq: Two times a day (BID) | ORAL | 0 refills | Status: DC

## 2018-01-16 NOTE — ED Triage Notes (Signed)
Pt c/o migraine onset last night. Denies nausea, sensitivity to light or sound. Pain in worse in the front. Has not taken anything for pain.

## 2018-01-16 NOTE — Discharge Instructions (Signed)
  Please seek prompt medical care if: You have: A very bad (severe) headache that is not helped by medicine. Trouble walking or weakness in your arms and legs. Clear or bloody fluid coming from your nose or ears. Changes in your seeing (vision). Jerky movements that you cannot control (seizure). You throw up (vomit). Your symptoms get worse. You lose balance. Your speech is slurred. You pass out. You are sleepier and have trouble staying awake. The black centers of your eyes (pupils) change in size.  These symptoms may be an emergency. Do not wait to see if the symptoms will go away. Get medical help right away. Call your local emergency services. Do not drive yourself to the hospital.  

## 2018-01-16 NOTE — ED Provider Notes (Signed)
  [facility]   [id number] 01/16/18 Arrival Time: 1210  ASSESSMENT & PLAN:  1. Nonintractable episodic headache, unspecified headache type     Meds ordered this encounter  Medications  . diclofenac (VOLTAREN) 75 MG EC tablet    Sig: Take 1 tablet (75 mg total) by mouth 2 (two) times daily.    Dispense:  14 tablet    Refill:  0   Ensure rest and fluids. Will return tomorrow if needed, ED if acute exacerbation. Work note given.  Reviewed expectations re: course of current medical issues. Questions answered. Outlined signs and symptoms indicating need for more acute intervention. Patient verbalized understanding. After Visit Summary given.   SUBJECTIVE:  Sarah Quinn is a 22 y.o. female who presents with complaint of a frontal R headache. Episodic. Usually a few times a month in same frontal location. Typically ibuprofen helps but not helping this time. Gradual onset last evening. No head injury. No n/v/photophobia. Ambulatory. No extremity sensation changes or weakness. No specific aggravating or alleviating factors reported. Recent URI that has resolved.  ROS: As per HPI.   OBJECTIVE:  Vitals:   01/16/18 1244  BP: 114/74  Pulse: 78  Temp: 98.7 F (37.1 C)  TempSrc: Oral  SpO2: 99%    General appearance: alert; no distress Eyes: PERRLA; EOMI; conjunctiva normal HENT: normocephalic; atraumatic Neck: supple with FROM Lungs: clear to auscultation bilaterally Heart: regular rate and rhythm Extremities: no edema; symmetrical with no gross deformities Skin: warm and dry Neurologic: CN 2-12 grossly intact; normal gait; normal symmetric reflexes; normal extremity strength and sensation throughout Psychological: alert and cooperative; normal mood and affect  No Known Allergies  Past Medical History:  Diagnosis Date  . Medical history non-contributory    Social History   Socioeconomic History  . Marital status: Single    Spouse name: Not on file  .  Number of children: Not on file  . Years of education: Not on file  . Highest education level: Not on file  Social Needs  . Financial resource strain: Not on file  . Food insecurity - worry: Not on file  . Food insecurity - inability: Not on file  . Transportation needs - medical: Not on file  . Transportation needs - non-medical: Not on file  Occupational History  . Not on file  Tobacco Use  . Smoking status: Never Smoker  . Smokeless tobacco: Never Used  Substance and Sexual Activity  . Alcohol use: No    Frequency: Never  . Drug use: No  . Sexual activity: Yes    Birth control/protection: None  Other Topics Concern  . Not on file  Social History Narrative  . Not on file    Past Surgical History:  Procedure Laterality Date  . NO PAST SURGERIES       Mardella LaymanHagler, Egon Dittus, MD 01/16/18 [phone]

## 2018-02-18 ENCOUNTER — Encounter: Payer: Self-pay | Admitting: Interventional Cardiology

## 2018-02-25 ENCOUNTER — Ambulatory Visit: Payer: Managed Care, Other (non HMO) | Admitting: Interventional Cardiology

## 2018-03-18 ENCOUNTER — Ambulatory Visit: Payer: Managed Care, Other (non HMO) | Admitting: Neurology

## 2018-03-18 ENCOUNTER — Telehealth: Payer: Self-pay | Admitting: *Deleted

## 2018-03-18 NOTE — Telephone Encounter (Signed)
Pt no showed appt on 03/18/2018 @ 2:30.

## 2018-03-23 ENCOUNTER — Encounter: Payer: Self-pay | Admitting: Neurology

## 2018-07-03 ENCOUNTER — Encounter (HOSPITAL_COMMUNITY): Payer: Self-pay | Admitting: Emergency Medicine

## 2018-07-03 ENCOUNTER — Emergency Department (HOSPITAL_COMMUNITY)
Admission: EM | Admit: 2018-07-03 | Discharge: 2018-07-03 | Disposition: A | Discharge status: Home / Self Care | Payer: Managed Care, Other (non HMO) | Attending: Emergency Medicine | Admitting: Emergency Medicine

## 2018-07-03 ENCOUNTER — Other Ambulatory Visit: Payer: Self-pay

## 2018-07-03 DIAGNOSIS — R112 Nausea with vomiting, unspecified: Secondary | ICD-10-CM | POA: Insufficient documentation

## 2018-07-03 DIAGNOSIS — Z79899 Other long term (current) drug therapy: Secondary | ICD-10-CM | POA: Diagnosis not present

## 2018-07-03 DIAGNOSIS — R197 Diarrhea, unspecified: Secondary | ICD-10-CM | POA: Insufficient documentation

## 2018-07-03 LAB — COMPREHENSIVE METABOLIC PANEL
ALK PHOS: 41 U/L (ref 38–126)
ALT: 17 U/L (ref 0–44)
AST: 22 U/L (ref 15–41)
Albumin: 4 g/dL (ref 3.5–5.0)
Anion gap: 9 (ref 5–15)
BUN: 14 mg/dL (ref 6–20)
CALCIUM: 9.1 mg/dL (ref 8.9–10.3)
CO2: 23 mmol/L (ref 22–32)
CREATININE: 1.02 mg/dL — AB (ref 0.44–1.00)
Chloride: 106 mmol/L (ref 98–111)
GFR calc non Af Amer: >60 mL/min (ref >60)
GLUCOSE: 134 mg/dL — AB (ref 70–99)
Potassium: 3.8 mmol/L (ref 3.5–5.1)
SODIUM: 138 mmol/L (ref 135–145)
Total Bilirubin: 0.7 mg/dL (ref 0.3–1.2)
Total Protein: 7 g/dL (ref 6.5–8.1)

## 2018-07-03 LAB — URINALYSIS, ROUTINE W REFLEX MICROSCOPIC
BILIRUBIN URINE: NEGATIVE
Glucose, UA: NEGATIVE mg/dL
Hgb urine dipstick: NEGATIVE
Ketones, ur: NEGATIVE mg/dL
LEUKOCYTES UA: NEGATIVE
NITRITE: NEGATIVE
Protein, ur: NEGATIVE mg/dL
SPECIFIC GRAVITY, URINE: 1.027 (ref 1.005–1.030)
pH: 6 (ref 5.0–8.0)

## 2018-07-03 LAB — CBC
HCT: 45 % (ref 36.0–46.0)
Hemoglobin: 14.7 g/dL (ref 12.0–15.0)
MCH: 30.9 pg (ref 26.0–34.0)
MCHC: 32.7 g/dL (ref 30.0–36.0)
MCV: 94.5 fL (ref 78.0–100.0)
PLATELETS: 325 10*3/uL (ref 150–400)
RBC: 4.76 MIL/uL (ref 3.87–5.11)
RDW: 12.2 % (ref 11.5–15.5)
WBC: 8 10*3/uL (ref 4.0–10.5)

## 2018-07-03 LAB — I-STAT BETA HCG BLOOD, ED (MC, WL, AP ONLY)

## 2018-07-03 LAB — LIPASE, BLOOD: Lipase: 72 U/L — ABNORMAL HIGH (ref 11–51)

## 2018-07-03 MED ORDER — ONDANSETRON 4 MG PO TBDP: 4.0000 mg | ORAL_TABLET | Freq: Three times a day (TID) | ORAL | 0 refills | Status: DC | PRN

## 2018-07-03 MED ORDER — LOPERAMIDE HCL 2 MG PO CAPS
2.0000 mg | ORAL_CAPSULE | Freq: Once | ORAL | Status: AC
  Administered 2018-07-03: 2 mg via ORAL
  Filled 2018-07-03: qty 1

## 2018-07-03 MED ORDER — ONDANSETRON 4 MG PO TBDP
4.0000 mg | ORAL_TABLET | Freq: Once | ORAL | Status: AC
  Administered 2018-07-03: 4 mg via ORAL
  Filled 2018-07-03: qty 1

## 2018-07-03 MED ORDER — LOPERAMIDE HCL 2 MG PO CAPS: 2.0000 mg | ORAL_CAPSULE | Freq: Four times a day (QID) | ORAL | 0 refills | Status: DC | PRN

## 2018-07-03 NOTE — ED Triage Notes (Signed)
Pt states she is unsure if it is related to seafood she at on July 4th but since then she has been unable to tolerate po intake and has been vomiting. No abdominal pain but endorses diarrhea.

## 2018-07-03 NOTE — ED Notes (Signed)
ED Provider at bedside. 

## 2018-07-03 NOTE — Discharge Instructions (Signed)
Take the prescribed medication as directed.  Drink lots of fluids. Follow-up with your primary care doctor. Return to the ED for new or worsening symptoms.

## 2018-07-03 NOTE — ED Provider Notes (Signed)
[facility] EMERGENCY DEPARTMENT Provider Note   CSN: [id number] Arrival date & time: 07/03/18  2053     History   Chief Complaint Chief Complaint  Patient presents with  . Emesis    HPI Sarah Quinn is a 22 y.o. female.  The history is provided by the patient and medical records.  Emesis   Associated symptoms include diarrhea.   22 y.o. F with hx of syncope, hx of STD's, presenting to the ED for vomiting.  States she ate a lot of seafood on 07/01/18 and since then she has been feeling very nauseated.  She reports 4 episodes of non-bloody, non-bilious emesis today as well as 2 episodes of non-bloody diarrhea.  She denies abdominal pain, just feels like "stomach is upset".  No one else that ate similar foods is sick.  States food tasted fine while she was eating it.  No fever/chills.  No prior abdominal surgeries.  No meds taken PTA.  Past Medical History:  Diagnosis Date  . Hx of chlamydia infection   . Hx of gonorrhea   . Medical history non-contributory   . Syncope     Patient Active Problem List   Diagnosis Date Noted  . SCOLIOSIS 08/31/2007    Past Surgical History:  Procedure Laterality Date  . NO PAST SURGERIES       OB History    Gravida  1   Para  1   Term  1   Preterm  0   AB  0   Living  1     SAB  0   TAB  0   Ectopic  0   Multiple  0   Live Births  1            Home Medications    Prior to Admission medications   Medication Sig Start Date End Date Taking? Authorizing Provider  diclofenac (VOLTAREN) 75 MG EC tablet Take 1 tablet (75 mg total) by mouth 2 (two) times daily. 01/16/18   Mardella Layman, MD  etonogestrel (NEXPLANON) 68 MG IMPL implant 1 each by Subdermal route once.    [provider]    Family History No family history on file.  Social History Social History   Tobacco Use  . Smoking status: Never Smoker  . Smokeless tobacco: Never Used  Substance Use Topics  . Alcohol use: No      Frequency: Never  . Drug use: No     Allergies   Patient has no known allergies.   Review of Systems Review of Systems  Gastrointestinal: Positive for diarrhea, nausea and vomiting.  All other systems reviewed and are negative.    Physical Exam Updated Vital Signs BP 104/63   Pulse (!) 55   SpO2 100%   Physical Exam  Constitutional: She is oriented to person, place, and time. She appears well-developed and well-nourished.  HENT:  Head: Normocephalic and atraumatic.  Mouth/Throat: Oropharynx is clear and moist.  Moist mucous membranes  Eyes: Pupils are equal, round, and reactive to light. Conjunctivae and EOM are normal.  Neck: Normal range of motion.  Cardiovascular: Normal rate, regular rhythm and normal heart sounds.  Pulmonary/Chest: Effort normal and breath sounds normal. No stridor. No respiratory distress.  Abdominal: Soft. Bowel sounds are normal. There is no tenderness. There is no rebound.  Musculoskeletal: Normal range of motion.  Neurological: She is alert and oriented to person, place, and time.  Skin: Skin is warm and dry.  Psychiatric:  She has a normal mood and affect.  Nursing note and vitals reviewed.    ED Treatments / Results  Labs (all labs ordered are listed, but only abnormal results are displayed) Labs Reviewed  LIPASE, BLOOD - Abnormal; Notable for the following components:      Result Value   Lipase 72 (*)    All other components within normal limits  COMPREHENSIVE METABOLIC PANEL - Abnormal; Notable for the following components:   Glucose, Bld 134 (*)    Creatinine, Ser 1.02 (*)    All other components within normal limits  URINALYSIS, ROUTINE W REFLEX MICROSCOPIC - Abnormal; Notable for the following components:   APPearance HAZY (*)    All other components within normal limits  CBC  I-STAT BETA HCG BLOOD, ED ([facility], [facility], [facility] ONLY)    EKG None  Radiology No results found.  Procedures Procedures (including critical care  time)  Medications Ordered in ED Medications  ondansetron (ZOFRAN-ODT) disintegrating tablet 4 mg (4 mg Oral Given 07/03/18 2303)  loperamide (IMODIUM) capsule 2 mg (2 mg Oral Given 07/03/18 2304)     Initial Impression / Assessment and Plan / ED Course  I have reviewed the triage vital signs and the nursing notes.  Pertinent labs & imaging results that were available during my care of the patient were reviewed by me and considered in my medical decision making (see chart for details).  22 year old female here with nausea, vomiting, and diarrhea after eating seafood 2 days ago.  She is afebrile and nontoxic.  Abdomen is soft and benign.  She does not appear clinically dehydrated.  Screening labs overall reassuring, lipase mildly elevated at 72 but do not feel this is clinically significant.  Patient given oral meds, will fluid challenge.  Likely can discharge with continued symptom Medicare.  11:50 PM Tolerated oral meds fine here.  No emesis or diarrhea while in ED.  Stable for d/c.  Continued supportive care at home.  Follow-up with PCP.  Discussed plan with patient, she acknowledged understanding and agreed with plan of care.  Return precautions given for new or worsening symptoms.  Final Clinical Impressions(s) / ED Diagnoses   Final diagnoses:  Nausea vomiting and diarrhea    ED Discharge Orders        Ordered    loperamide (IMODIUM) 2 MG capsule  4 times daily PRN     07/03/18 2350    ondansetron (ZOFRAN ODT) 4 MG disintegrating tablet  Every 8 hours PRN     07/03/18 2350       Garlon HatchetSanders, Talik Casique M, PA-C 07/03/18 2358    Donnetta Hutchingook, Brian, MD 07/04/18 [phone]

## 2018-09-01 ENCOUNTER — Ambulatory Visit (INDEPENDENT_AMBULATORY_CARE_PROVIDER_SITE_OTHER): Payer: Self-pay | Admitting: General Practice

## 2018-09-01 ENCOUNTER — Other Ambulatory Visit: Payer: Self-pay | Admitting: Advanced Practice Midwife

## 2018-09-01 ENCOUNTER — Encounter: Payer: Self-pay | Admitting: Family Medicine

## 2018-09-01 DIAGNOSIS — Z3202 Encounter for pregnancy test, result negative: Secondary | ICD-10-CM

## 2018-09-01 DIAGNOSIS — N926 Irregular menstruation, unspecified: Secondary | ICD-10-CM

## 2018-09-01 LAB — POCT PREGNANCY, URINE: PREG TEST UR: NEGATIVE

## 2018-09-01 NOTE — Progress Notes (Signed)
ATTESTATION OF SUPERVISION OF RN: Evaluation and management procedures were performed by the RN under my supervision and collaboration. I have reviewed the nursing note and chart and agree with the management and plan for this patient.  Samantha Weinhold, CNM  

## 2018-09-01 NOTE — Progress Notes (Signed)
Patient presents to office for upt today. UPT -. Patient reports LMP 07/16/18 & is not on birth control. Patient has not taken any pregnancy tests at home. Patient reports GI upset over several weeks. Recommended follow up with a PCP & provided information to [facility]. Patient verbalized understanding & had no questions.

## 2018-11-22 ENCOUNTER — Encounter (HOSPITAL_COMMUNITY): Payer: Self-pay | Admitting: Emergency Medicine

## 2018-11-22 ENCOUNTER — Emergency Department (HOSPITAL_COMMUNITY)
Admission: EM | Admit: 2018-11-22 | Discharge: 2018-11-22 | Disposition: A | Discharge status: Home / Self Care | Payer: Self-pay | Attending: Emergency Medicine | Admitting: Emergency Medicine

## 2018-11-22 DIAGNOSIS — Y939 Activity, unspecified: Secondary | ICD-10-CM | POA: Insufficient documentation

## 2018-11-22 DIAGNOSIS — Y999 Unspecified external cause status: Secondary | ICD-10-CM | POA: Insufficient documentation

## 2018-11-22 DIAGNOSIS — W109XXA Fall (on) (from) unspecified stairs and steps, initial encounter: Secondary | ICD-10-CM | POA: Insufficient documentation

## 2018-11-22 DIAGNOSIS — T148XXA Other injury of unspecified body region, initial encounter: Secondary | ICD-10-CM

## 2018-11-22 DIAGNOSIS — Z79899 Other long term (current) drug therapy: Secondary | ICD-10-CM | POA: Insufficient documentation

## 2018-11-22 DIAGNOSIS — W19XXXA Unspecified fall, initial encounter: Secondary | ICD-10-CM

## 2018-11-22 DIAGNOSIS — S80812A Abrasion, left lower leg, initial encounter: Secondary | ICD-10-CM | POA: Insufficient documentation

## 2018-11-22 DIAGNOSIS — Y929 Unspecified place or not applicable: Secondary | ICD-10-CM | POA: Insufficient documentation

## 2018-11-22 NOTE — ED Provider Notes (Signed)
[facility] EMERGENCY [facility]PARTMENT Provider Note   CSN: [id number] Arrival date & time: 11/22/18  1242   History   Chief Complaint Chief Complaint  Patient presents with  . Fall   HPI Sarah Quinn is a 22 y.o. female.  HPI   22 year old female presents today status post fall.  Patient notes over the weekend she was going downstairs when the person in front of her pulled her down accidentally.  She notes striking the left anterior aspect of her knee and leg.  She notes a superficial abrasion.  She reports she went to work today but was limping and was told she needed to go home.  Patient denies any pain throughout the knee with weightbearing activities only at the proximal tibialis muscle.  She notes pain is worse with dorsiflexion.  She is uncertain when her last tetanus shot was.  Past Medical History:  Diagnosis Date  . Hx of chlamydia infection   . Hx of gonorrhea   . Medical history non-contributory   . Syncope     Patient Active Problem List   Diagnosis Date Noted  . SCOLIOSIS 08/31/2007    Past Surgical History:  Procedure Laterality Date  . NO PAST SURGERIES       OB History    Gravida  1   Para  1   Term  1   Preterm  0   AB  0   Living  1     SAB  0   TAB  0   Ectopic  0   Multiple  0   Live Births  1            Home Medications    Prior to Admission medications   Medication Sig Start Date End Date Taking? Authorizing Provider  diclofenac (VOLTAREN) 75 MG EC tablet Take 1 tablet (75 mg total) by mouth 2 (two) times daily. 01/16/18   Mardella LaymanHagler, Brian, MD  etonogestrel (NEXPLANON) 68 MG IMPL implant 1 each by Subdermal route once.    [provider]  loperamide (IMODIUM) 2 MG capsule Take 1 capsule (2 mg total) by mouth 4 (four) times daily as needed for diarrhea or loose stools. 07/03/18   Garlon HatchetSanders, Lisa M, PA-C  ondansetron (ZOFRAN ODT) 4 MG disintegrating tablet Take 1 tablet (4 mg total) by mouth every 8  (eight) hours as needed for nausea. 07/03/18   Garlon HatchetSanders, Lisa M, PA-C    Family History History reviewed. No pertinent family history.  Social History Social History   Tobacco Use  . Smoking status: Never Smoker  . Smokeless tobacco: Never Used  Substance Use Topics  . Alcohol use: No    Frequency: Never  . Drug use: No     Allergies   Patient has no known allergies.   Review of Systems Review of Systems  All other systems reviewed and are negative.    Physical Exam Updated Vital Signs BP 115/67 (BP Location: Right Arm)   Pulse 77   Temp 98.3 F (36.8 C) (Oral)   Resp 17   LMP 11/21/2018   SpO2 99%   Physical Exam  Constitutional: She is oriented to person, place, and time. She appears well-developed and well-nourished.  HENT:  Head: Normocephalic and atraumatic.  Eyes: Pupils are equal, round, and reactive to light. Conjunctivae are normal. Right eye exhibits no discharge. Left eye exhibits no discharge. No scleral icterus.  Neck: Normal range of motion. No JVD present. No tracheal deviation present.  Pulmonary/Chest: Effort normal. No stridor.  Musculoskeletal:  Superficial abrasion noted over the left proximal tibialis anterior, no surrounding redness, pain with dorsiflexion, no pain with plantar flexion, no pain with weightbearing.  Anterior knee nontender to palpation no laxity no edema- no surrounding redness  Neurological: She is alert and oriented to person, place, and time. Coordination normal.  Psychiatric: She has a normal mood and affect. Her behavior is normal. Judgment and thought content normal.  Nursing note and vitals reviewed.   ED Treatments / Results  Labs (all labs ordered are listed, but only abnormal results are displayed) Labs Reviewed - No data to display  EKG None  Radiology No results found.  Procedures Procedures (including critical care time)  Medications Ordered in ED Medications - No data to display   Initial  Impression / Assessment and Plan / ED Course  I have reviewed the triage vital signs and the nursing notes.  Pertinent labs & imaging results that were available during my care of the patient were reviewed by me and considered in my medical decision making (see chart for details).     Labs:   Imaging:  Consults:  Therapeutics:  Discharge Meds:   Assessment/Plan: 22 year old female presents status post fall.  Patient with superficial abrasion, no bony abnormalities noted.  Patient is uncertain when her last tetanus was, she will not receive one here today, she understands the risks of not receiving tetanus, encouraged her to follow-up her primary care provider.  Strict return precautions given, she verbalized understanding and agreement to today's plan.   Final Clinical Impressions(s) / ED Diagnoses   Final diagnoses:  Fall, initial encounter  Abrasion    ED Discharge Orders    None       Eyvonne Mechanic, PA-C 11/22/18 1325    Doug Sou, MD 11/22/18 [phone]

## 2018-11-22 NOTE — ED Triage Notes (Signed)
Pt states she fell down 8 stairs on Saturday- no LOC did not hit head. Pt complains of on going left knee pain. Pt ambulatory, but limping from pain.

## 2018-11-22 NOTE — Discharge Instructions (Addendum)
Please read attached information.  Please follow-up with the primary care for Tdap vaccination as it is recommended.  Return immediately if develop any new or worsening signs or symptoms.

## 2018-12-23 ENCOUNTER — Emergency Department (HOSPITAL_COMMUNITY)
Admission: EM | Admit: 2018-12-23 | Discharge: 2018-12-24 | Disposition: A | Discharge status: Home / Self Care | Payer: Medicaid Other | Attending: Emergency Medicine | Admitting: Emergency Medicine

## 2018-12-23 ENCOUNTER — Encounter (HOSPITAL_COMMUNITY): Payer: Self-pay

## 2018-12-23 ENCOUNTER — Other Ambulatory Visit: Payer: Self-pay

## 2018-12-23 DIAGNOSIS — O26891 Other specified pregnancy related conditions, first trimester: Secondary | ICD-10-CM | POA: Diagnosis present

## 2018-12-23 DIAGNOSIS — E876 Hypokalemia: Secondary | ICD-10-CM

## 2018-12-23 DIAGNOSIS — Z3A01 Less than 8 weeks gestation of pregnancy: Secondary | ICD-10-CM | POA: Diagnosis not present

## 2018-12-23 DIAGNOSIS — R1032 Left lower quadrant pain: Secondary | ICD-10-CM | POA: Diagnosis not present

## 2018-12-23 LAB — CBC
HCT: 41.5 % (ref 36.0–46.0)
Hemoglobin: 13.9 g/dL (ref 12.0–15.0)
MCH: 30.5 pg (ref 26.0–34.0)
MCHC: 33.5 g/dL (ref 30.0–36.0)
MCV: 91 fL (ref 80.0–100.0)
NRBC: 0 % (ref 0.0–0.2)
Platelets: 374 10*3/uL (ref 150–400)
RBC: 4.56 MIL/uL (ref 3.87–5.11)
RDW: 12.4 % (ref 11.5–15.5)
WBC: 12 10*3/uL — ABNORMAL HIGH (ref 4.0–10.5)

## 2018-12-23 LAB — URINALYSIS, ROUTINE W REFLEX MICROSCOPIC
BILIRUBIN URINE: NEGATIVE
Glucose, UA: NEGATIVE mg/dL
Ketones, ur: 20 mg/dL — AB
Nitrite: NEGATIVE
Protein, ur: NEGATIVE mg/dL
Specific Gravity, Urine: 1.016 (ref 1.005–1.030)
pH: 6 (ref 5.0–8.0)

## 2018-12-23 LAB — COMPREHENSIVE METABOLIC PANEL
ALK PHOS: 39 U/L (ref 38–126)
ALT: 17 U/L (ref 0–44)
AST: 19 U/L (ref 15–41)
Albumin: 3.7 g/dL (ref 3.5–5.0)
Anion gap: 5 (ref 5–15)
BUN: 7 mg/dL (ref 6–20)
CO2: 28 mmol/L (ref 22–32)
CREATININE: 1.03 mg/dL — AB (ref 0.44–1.00)
Calcium: 9.1 mg/dL (ref 8.9–10.3)
Chloride: 104 mmol/L (ref 98–111)
GFR calc Af Amer: >60 mL/min (ref >60)
GFR calc non Af Amer: >60 mL/min (ref >60)
Glucose, Bld: 84 mg/dL (ref 70–99)
Potassium: 3.3 mmol/L — ABNORMAL LOW (ref 3.5–5.1)
Sodium: 137 mmol/L (ref 135–145)
Total Bilirubin: 0.8 mg/dL (ref 0.3–1.2)
Total Protein: 7.1 g/dL (ref 6.5–8.1)

## 2018-12-23 LAB — I-STAT BETA HCG BLOOD, ED (MC, WL, AP ONLY): I-stat hCG, quantitative: 1130 m[IU]/mL — ABNORMAL HIGH (ref <5)

## 2018-12-23 LAB — LIPASE, BLOOD: Lipase: 20 U/L (ref 11–51)

## 2018-12-23 NOTE — ED Triage Notes (Signed)
Pt here for abdominal pain and took pregnancy test and it was positive.  Pain in central abdomen and goes to bilateral flanks. A&Ox4.

## 2018-12-24 ENCOUNTER — Emergency Department (HOSPITAL_COMMUNITY): Payer: Medicaid Other

## 2018-12-24 LAB — HCG, QUANTITATIVE, PREGNANCY: hCG, Beta Chain, Quant, S: 1273 m[IU]/mL — ABNORMAL HIGH (ref <5)

## 2018-12-24 LAB — ABO/RH: ABO/RH(D): O POS

## 2018-12-24 MED ORDER — PRENATAL COMPLETE 14-0.4 MG PO TABS: 2.0000 | ORAL_TABLET | Freq: Every day | ORAL | 0 refills | Status: AC

## 2018-12-24 MED ORDER — POTASSIUM CHLORIDE CRYS ER 20 MEQ PO TBCR: 40.0000 meq | EXTENDED_RELEASE_TABLET | Freq: Once | ORAL | Status: DC

## 2018-12-24 MED ORDER — ACETAMINOPHEN 500 MG PO TABS
1000.0000 mg | ORAL_TABLET | Freq: Once | ORAL | Status: AC
  Administered 2018-12-24: 1000 mg via ORAL
  Filled 2018-12-24: qty 2

## 2018-12-24 NOTE — ED Provider Notes (Signed)
[facility] EMERGENCY DEPARTMENT Provider Note   CSN: [id number] Arrival date & time: 12/23/18  1839     History   Chief Complaint Chief Complaint  Patient presents with  . Abdominal Pain  . Possible Pregnancy    HPI Sarah Quinn is a 22 y.o. female G18P0101 with a hx of  presents to the Emergency Department complaining of gradual, persistent, progressively worsening left lower quadrant abdominal pain onset several hours prior to arrival.  Patient reports she took a pregnancy test at home x3 and became upset.  She reports after this her abdominal pain started.  She reports she is sexually active with a female partner but is not currently using any contraception.  Patient denies vaginal bleeding.  Last menstrual cycle was November.  Patient denies fever, chills, headache, neck pain, chest pain, shortness of breath, nausea, vomiting, diarrhea, weakness, dizziness, syncope, dysuria, hematuria.  Aggravating or alleviating factors.  No treatments prior to arrival  The history is provided by the patient and medical records. No language interpreter was used.    Past Medical History:  Diagnosis Date  . Hx of chlamydia infection   . Hx of gonorrhea   . Medical history non-contributory   . Syncope     Patient Active Problem List   Diagnosis Date Noted  . SCOLIOSIS 08/31/2007    Past Surgical History:  Procedure Laterality Date  . NO PAST SURGERIES       OB History    Gravida  1   Para  1   Term  1   Preterm  0   AB  0   Living  1     SAB  0   TAB  0   Ectopic  0   Multiple  0   Live Births  1            Home Medications    Prior to Admission medications   Medication Sig Start Date End Date Taking? Authorizing Provider  diclofenac (VOLTAREN) 75 MG EC tablet Take 1 tablet (75 mg total) by mouth 2 (two) times daily. Patient not taking: Reported on 12/24/2018 01/16/18   Mardella Layman, MD  loperamide (IMODIUM) 2 MG capsule Take 1  capsule (2 mg total) by mouth 4 (four) times daily as needed for diarrhea or loose stools. Patient not taking: Reported on 12/24/2018 07/03/18   Garlon Hatchet, PA-C  ondansetron (ZOFRAN ODT) 4 MG disintegrating tablet Take 1 tablet (4 mg total) by mouth every 8 (eight) hours as needed for nausea. Patient not taking: Reported on 12/24/2018 07/03/18   Garlon Hatchet, PA-C  Prenatal Vit-Fe Fumarate-FA (PRENATAL COMPLETE) 14-0.4 MG TABS Take 2 tablets by mouth daily. 12/24/18   Jennie Hannay, Dahlia Client, PA-C    Family History History reviewed. No pertinent family history.  Social History Social History   Tobacco Use  . Smoking status: Never Smoker  . Smokeless tobacco: Never Used  Substance Use Topics  . Alcohol use: No    Frequency: Never  . Drug use: No     Allergies   Patient has no known allergies.   Review of Systems Review of Systems  Constitutional: Negative for appetite change, diaphoresis, fatigue, fever and unexpected weight change.  HENT: Negative for mouth sores.   Eyes: Negative for visual disturbance.  Respiratory: Negative for cough, chest tightness, shortness of breath and wheezing.   Cardiovascular: Negative for chest pain.  Gastrointestinal: Positive for abdominal pain. Negative for constipation, diarrhea, nausea and vomiting.  Endocrine: Negative for polydipsia, polyphagia and polyuria.  Genitourinary: Negative for dysuria, frequency, hematuria and urgency.  Musculoskeletal: Negative for back pain and neck stiffness.  Skin: Negative for rash.  Allergic/Immunologic: Negative for immunocompromised state.  Neurological: Negative for syncope, light-headedness and headaches.  Hematological: Does not bruise/bleed easily.  Psychiatric/Behavioral: Negative for sleep disturbance. The patient is not nervous/anxious.      Physical Exam Updated Vital Signs BP 114/82 (BP Location: Right Arm)   Pulse 85   Temp 98.9 F (37.2 C) (Oral)   Resp 16   SpO2 98%   Physical  Exam Vitals signs and nursing note reviewed.  Constitutional:      General: She is not in acute distress.    Appearance: She is well-developed. She is not diaphoretic.     Comments: Awake, alert, nontoxic appearance  HENT:     Head: Normocephalic and atraumatic.     Mouth/Throat:     Pharynx: No oropharyngeal exudate.  Eyes:     General: No scleral icterus.    Conjunctiva/sclera: Conjunctivae normal.  Neck:     Musculoskeletal: Normal range of motion and neck supple.  Cardiovascular:     Rate and Rhythm: Normal rate and regular rhythm.  Pulmonary:     Effort: Pulmonary effort is normal. No respiratory distress.     Breath sounds: Normal breath sounds. No wheezing.  Abdominal:     General: Bowel sounds are normal.     Palpations: Abdomen is soft. There is no mass.     Tenderness: There is abdominal tenderness in the left lower quadrant. There is no right CVA tenderness, left CVA tenderness, guarding or rebound.  Musculoskeletal: Normal range of motion.  Skin:    General: Skin is warm and dry.  Neurological:     Mental Status: She is alert.     Comments: Speech is clear and goal oriented Moves extremities without ataxia      ED Treatments / Results  Labs (all labs ordered are listed, but only abnormal results are displayed) Labs Reviewed  COMPREHENSIVE METABOLIC PANEL - Abnormal; Notable for the following components:      Result Value   Potassium 3.3 (*)    Creatinine, Ser 1.03 (*)    All other components within normal limits  CBC - Abnormal; Notable for the following components:   WBC 12.0 (*)    All other components within normal limits  URINALYSIS, ROUTINE W REFLEX MICROSCOPIC - Abnormal; Notable for the following components:   Hgb urine dipstick SMALL (*)    Ketones, ur 20 (*)    Leukocytes, UA TRACE (*)    Bacteria, UA MANY (*)    All other components within normal limits  HCG, QUANTITATIVE, PREGNANCY - Abnormal; Notable for the following components:   hCG,  Beta Chain, Quant, S 1,273 (*)    All other components within normal limits  I-STAT BETA HCG BLOOD, ED ([facility], [facility], [facility] ONLY) - Abnormal; Notable for the following components:   I-stat hCG, quantitative 1,130.0 (*)    All other components within normal limits  URINE CULTURE  LIPASE, BLOOD  ABO/RH     Radiology US Ob Comp < 14 Wks  Result Date: 12/24/2018 CLINICAL DATA:  Acute onset of generalized abdominal pain. EXAM: OBSTETRIC <14 WK ULTRASOUND TECHNIQUE: Transabdominal ultrasound was performed for evaluation of the gestation as well as the maternal uterus and adnexal regions. COMPARISON:  Pelvic ultrasound performed 12/06/2009 FINDINGS: Intrauterine gestational sac: Single; visualized and grossly normal in shape. Yolk sac:  No Embryo:  No MSD: 3.2  mm   5 w   0  d Subchorionic hemorrhage:  None visualized. Maternal uterus/adnexae: There is a 1.4 cm cystic structure at the left ovary, with surrounding soft tissue density and peripherally increased blood flow. A similar left ovarian lesion was noted on the ultrasound from September 2010, and this is thought to more likely reflect a corpus luteal cyst or chronic cystic lesion, though ectopic pregnancy cannot be entirely excluded. The right ovary is not visualized on this study. Trace free fluid is noted at the right adnexa. IMPRESSION: 1. Suspect single intrauterine gestational sac, measuring 3 mm in mean sac diameter, corresponding to a gestational age of [redacted] weeks 0 days. This matches the gestational age of [redacted] weeks 0 days by LMP, reflecting an estimated date of delivery of August 19, 2019. No yolk sac or embryo is yet seen. 2. 1.4 cm cystic structure at the left ovary with surrounding blood flow and soft tissue density is thought to reflect a corpus luteal cyst or chronic cystic lesion. Ectopic pregnancy is considered less likely. The patient's quantitative beta HCG of 1,273 seems too low for the size of cystic structure to reflect an ectopic. Follow-up  pelvic ultrasound is recommended in 1-2 weeks if the quantitative HCG continues to trend upward, to exclude ectopic pregnancy and confirm that the intrauterine gestational sac continues to grow. These results were called by telephone at the time of interpretation on 12/24/2018 at 2:30 am to [facility] Nichola Cieslinski PA, who verbally acknowledged these results. Electronically Signed   By: Roanna RaiderJeffery  Chang M.D.   On: 12/24/2018 02:34    Procedures Procedures (including critical care time)  Medications Ordered in ED Medications  potassium chloride SA (K-DUR,KLOR-CON) CR tablet 40 mEq (has no administration in time range)  acetaminophen (TYLENOL) tablet 1,000 mg (1,000 mg Oral Given 12/24/18 0101)     Initial Impression / Assessment and Plan / ED Course  I have reviewed the triage vital signs and the nursing notes.  Pertinent labs & imaging results that were available during my care of the patient were reviewed by me and considered in my medical decision making (see chart for details).     Presents with left lower quadrant abdominal pain after finding out she was pregnant.  Labs reassuring.  hCG 1200.  Urinalysis without evidence of urinary tract infection.  Mild hypokalemia.  Potassium given here in the emergency department.  Mild leukocytosis.  On arrival, patient tachycardic however no persistent tachycardia here in the emergency department.  Ultrasound with IUP and questionable cystic structure on the left ovary.  Images were discussed with the radiologist who feels this is most likely a corpus luteum.  He recommends follow-up ultrasound in 1 to 2 weeks.  Long discussion with patient about today's results.  Her abdominal pain has resolved completely after Tylenol administration.  Repeat exam is soft and nontender.  Pt remains without vaginal bleeding. I discussed with patient the likely corpus luteum cyst on her left ovary however cannot 100% rule out ectopic or heterotopic pregnancy.  Patient will  need 48-hour follow-up for hCG trending.  She may potentially need an additional ultrasound.  Patient is to present to the MAU sooner than 48 hours if her pain returns or she develops vaginal bleeding.  Patient states understanding and is in agreement with this plan.  BP 114/82 (BP Location: Right Arm)   Pulse 85   Temp 98.9 F (37.2 C) (Oral)   Resp 16  SpO2 98%    Final Clinical Impressions(s) / ED Diagnoses   Final diagnoses:  Less than [redacted] weeks gestation of pregnancy  Left lower quadrant abdominal pain    ED Discharge Orders         Ordered    Prenatal Vit-Fe Fumarate-FA (PRENATAL COMPLETE) 14-0.4 MG TABS  Daily     12/24/18 0308           Windsor Goeken, Dahlia ClientHannah, PA-C 12/24/18 [id number]    Shaune PollackIsaacs, Cameron, MD 12/24/18 1932

## 2018-12-24 NOTE — Discharge Instructions (Addendum)
1. Medications: prenatal vitamin, usual home medications 2. Treatment: rest, drink plenty of fluids, advance diet slowly 3. Follow Up: Please followup with your OB/GYN or MAU in 48 hours for repeat blood work and potentially repeat ultrasound as needed; Please return to the ER/MAU for persistent vomiting, high fevers, development of vaginal bleeding, worsening pain or worsening symptoms

## 2018-12-24 NOTE — ED Notes (Signed)
Pt discharged from ED; instructions provided and scripts given; Pt encouraged to return to ED if symptoms worsen and to f/u with PCP; Pt verbalized understanding of all instructions 

## 2018-12-26 ENCOUNTER — Inpatient Hospital Stay (HOSPITAL_COMMUNITY)
Admission: AD | Admit: 2018-12-26 | Discharge: 2018-12-26 | Disposition: A | Discharge status: Home / Self Care | Payer: Medicaid Other | Source: Ambulatory Visit | Attending: Obstetrics and Gynecology | Admitting: Obstetrics and Gynecology

## 2018-12-26 ENCOUNTER — Encounter: Payer: Self-pay | Admitting: Student

## 2018-12-26 DIAGNOSIS — O26891 Other specified pregnancy related conditions, first trimester: Secondary | ICD-10-CM

## 2018-12-26 DIAGNOSIS — R109 Unspecified abdominal pain: Secondary | ICD-10-CM

## 2018-12-26 DIAGNOSIS — Z3491 Encounter for supervision of normal pregnancy, unspecified, first trimester: Secondary | ICD-10-CM | POA: Diagnosis present

## 2018-12-26 LAB — HCG, QUANTITATIVE, PREGNANCY: hCG, Beta Chain, Quant, S: 3290 m[IU]/mL — ABNORMAL HIGH

## 2018-12-26 LAB — URINE CULTURE: Culture: >=100000 — AB

## 2018-12-26 NOTE — MAU Note (Signed)
Here for follow up HCG   No pain or bleeding 

## 2018-12-26 NOTE — MAU Provider Note (Signed)
Ms. Sarah Quinn  is a 22 y.o. G2P1001  at 5443w0d who presents to MAU today for follow-up quant hCG. The patient denies abdominal pain, vaginal bleeding, N/V or fever.   BP 101/74 (BP Location: Right Arm)   Pulse (!) 104   Temp 98.3 F (36.8 C) (Oral)   Resp 16   Wt 66.7 kg   LMP 11/21/2018   BMI 23.73 kg/m   GENERAL: Well-developed, well-nourished female in no acute distress.  HEENT: Normocephalic, atraumatic.   LUNGS: Effort normal HEART: Regular rate  SKIN: Warm, dry and without erythema PSYCH: Normal mood and affect   A: Appropriate rise in quant hCG after 48 hours Component     Latest Ref Rng & Units 12/24/2018 12/26/2018  HCG, Beta Chain, Quant, S     <5 mIU/mL 1,273 (H) 3,290 (H)    P: Discharge home Bleeding/Ectopic precautions discussed Follow-up US ordered for 1 week Patient may return to MAU as needed or if her condition were to change or worsen   Judeth HornLawrence, Mayjor Ager, NP  12/26/2018 1:58 PM

## 2018-12-26 NOTE — Discharge Instructions (Signed)
Return to care  If you have heavier bleeding that soaks through more that 2 pads per hour for an hour or more If you bleed so much that you feel like you might pass out or you do pass out If you have significant abdominal pain that is not improved with Tylenol   

## 2018-12-27 ENCOUNTER — Telehealth: Payer: Self-pay | Admitting: Emergency Medicine

## 2018-12-27 NOTE — Progress Notes (Signed)
ED Antimicrobial Stewardship Positive Culture Follow Up   Sarah Quinn is an 22 y.o. female who [facility] with a chief complaint of  Chief Complaint  Patient presents with  . Abdominal Pain  . Possible Pregnancy    Recent Results (from the past 720 hour(s))  Urine culture     Status: Abnormal   Collection Time: 12/23/18 11:45 PM  Result Value Ref Range Status   Specimen Description URINE, CLEAN CATCH  Final   Special Requests   Final    NONE Performed at [facility], [address] [place], [place] [address]    Culture >=100,000 COLONIES/mL ESCHERICHIA COLI (A)  Final   Report Status 12/26/2018 FINAL  Final   Organism ID, Bacteria ESCHERICHIA COLI (A)  Final      Susceptibility   Escherichia coli - MIC*    AMPICILLIN <=2 SENSITIVE Sensitive     CEFAZOLIN <=4 SENSITIVE Sensitive     CEFTRIAXONE <=1 SENSITIVE Sensitive     CIPROFLOXACIN <=0.25 SENSITIVE Sensitive     GENTAMICIN 2 SENSITIVE Sensitive     IMIPENEM <=0.25 SENSITIVE Sensitive     NITROFURANTOIN 32 SENSITIVE Sensitive     TRIMETH/SULFA <=20 SENSITIVE Sensitive     AMPICILLIN/SULBACTAM <=2 SENSITIVE Sensitive     PIP/TAZO <=4 SENSITIVE Sensitive     Extended ESBL NEGATIVE Sensitive     * >=100,000 COLONIES/mL ESCHERICHIA COLI    []  Treated with N/A, organism resistant to prescribed antimicrobial [x]  Patient discharged originally without antimicrobial agent and treatment is now indicated  New antibiotic prescription: amoxicillin 500mg  PO TID x 5 days  ED Provider: Burna FortsJeff Hedges, PA   Aleiah Mohammed, Drake Leachachel Lynn 12/27/2018, 8:31 AM Clinical Pharmacist Monday - Friday phone -  [phone] Saturday - Sunday phone - [phone]

## 2018-12-27 NOTE — Telephone Encounter (Signed)
Post ED Visit - Positive Culture Follow-up: Successful Patient Follow-Up  Culture assessed and recommendations reviewed by:  []  Enzo BiNathan Batchelder, Pharm.D. []  Celedonio MiyamotoJeremy Frens, Pharm.D., BCPS AQ-ID [x]  Garvin FilaMike Maccia, Pharm.D., BCPS []  Georgina PillionElizabeth Martin, Pharm.D., BCPS []  HamtramckMinh Pham, [address] BoulevardPharm.D., BCPS, AAHIVP []  Estella HuskMichelle Turner, Pharm.D., BCPS, AAHIVP []  Lysle Pearlachel Rumbarger, PharmD, BCPS []  Phillips Climeshuy Dang, PharmD, BCPS []  Agapito GamesAlison Masters, PharmD, BCPS []  Verlan FriendsErin Deja, PharmD  Positive urine culture  [x]  Patient discharged without antimicrobial prescription and treatment is now indicated []  Organism is resistant to prescribed ED discharge antimicrobial []  Patient with positive blood cultures  Changes discussed with ED provider: Eyvonne MechanicJeffrey Hedges PA New antibiotic prescription start Amoxicillin 500mg  po q 8 hours x 5 days  Attempting to contact patient   Berle MullMiller, Azile Minardi 12/27/2018, 11:00 AM

## 2018-12-29 NOTE — L&D Delivery Note (Signed)
Delivery Note At 9:42 AM a viable female was delivered via Vaginal, Spontaneous (Presentation: OA;  ).  APGAR: 8, 9; weight  pending Placenta status: to pathology.  Cord:  with the following complications: .  Cord pH: n/a  Anesthesia:  epidural Episiotomy: None Lacerations: None;Periurethral Suture Repair: vicryl 4-0 Est. Blood Loss (mL): 13  Mom to postpartum.  Baby to Couplet care / Skin to Skin.  Annalee Genta 08/21/2019, 9:58 AM

## 2019-01-04 ENCOUNTER — Encounter: Payer: Self-pay | Admitting: Family Medicine

## 2019-01-04 ENCOUNTER — Ambulatory Visit (HOSPITAL_COMMUNITY)
Admission: RE | Admit: 2019-01-04 | Discharge: 2019-01-04 | Disposition: A | Discharge status: Home / Self Care | Payer: Medicaid Other | Source: Ambulatory Visit | Attending: Student | Admitting: Student

## 2019-01-04 ENCOUNTER — Ambulatory Visit (INDEPENDENT_AMBULATORY_CARE_PROVIDER_SITE_OTHER): Payer: Medicaid Other

## 2019-01-04 DIAGNOSIS — O26891 Other specified pregnancy related conditions, first trimester: Secondary | ICD-10-CM | POA: Insufficient documentation

## 2019-01-04 DIAGNOSIS — R109 Unspecified abdominal pain: Secondary | ICD-10-CM | POA: Insufficient documentation

## 2019-01-04 DIAGNOSIS — Z712 Person consulting for explanation of examination or test findings: Secondary | ICD-10-CM

## 2019-01-04 NOTE — Progress Notes (Signed)
Pt here today for OB US results.  Results confirm viable pregnancy.  Medications reconciled.  Proof of pregnancy letter provided by the front office to start prenatal care.

## 2019-01-07 NOTE — Progress Notes (Signed)
Chart reviewed for nurse visit. Agree with plan of care.   Marylene Land, CNM 01/07/2019 8:24 PM

## 2019-01-27 ENCOUNTER — Inpatient Hospital Stay (HOSPITAL_COMMUNITY)
Admission: AD | Admit: 2019-01-27 | Discharge: 2019-01-27 | Disposition: A | Discharge status: Home / Self Care | Payer: Medicaid Other | Attending: Obstetrics & Gynecology | Admitting: Obstetrics & Gynecology

## 2019-01-27 ENCOUNTER — Other Ambulatory Visit: Payer: Self-pay

## 2019-01-27 ENCOUNTER — Encounter (HOSPITAL_COMMUNITY): Payer: Self-pay | Admitting: *Deleted

## 2019-01-27 DIAGNOSIS — O219 Vomiting of pregnancy, unspecified: Secondary | ICD-10-CM | POA: Diagnosis not present

## 2019-01-27 DIAGNOSIS — Z3A09 9 weeks gestation of pregnancy: Secondary | ICD-10-CM | POA: Diagnosis not present

## 2019-01-27 DIAGNOSIS — O210 Mild hyperemesis gravidarum: Secondary | ICD-10-CM | POA: Insufficient documentation

## 2019-01-27 LAB — URINALYSIS, ROUTINE W REFLEX MICROSCOPIC
Bilirubin Urine: NEGATIVE
GLUCOSE, UA: NEGATIVE mg/dL
Ketones, ur: NEGATIVE mg/dL
Nitrite: POSITIVE — AB
Protein, ur: NEGATIVE mg/dL
Specific Gravity, Urine: 1.02 (ref 1.005–1.030)
pH: 7 (ref 5.0–8.0)

## 2019-01-27 LAB — URINALYSIS, MICROSCOPIC (REFLEX)

## 2019-01-27 MED ORDER — ONDANSETRON 4 MG PO TBDP
4.0000 mg | ORAL_TABLET | Freq: Once | ORAL | Status: AC
  Administered 2019-01-27: 4 mg via ORAL
  Filled 2019-01-27: qty 1

## 2019-01-27 MED ORDER — DOXYLAMINE-PYRIDOXINE ER 20-20 MG PO TBCR: 1.0000 | EXTENDED_RELEASE_TABLET | Freq: Two times a day (BID) | ORAL | 0 refills | Status: DC

## 2019-01-27 NOTE — MAU Provider Note (Signed)
History     CSN: [id number]  Arrival date and time: 01/27/19 2863   First Provider Initiated Contact with Patient 01/27/19 1025      Chief Complaint  Patient presents with  . Emesis  . Headache   HPI Sarah Quinn is a 23 y.o. G2P1001 at [redacted]w[redacted]d who presents to MAU with chief complaint of nausea and vomiting. She endorses "nonstop" vomiting for the past 24 hours. She denies abdominal pain or tenderness, vaginal bleeding, SOB, lightheadedness, fever or recent illness.  Patient verbalizes she was prescribed an antiemetic but has not picked up the prescription yet.  OB History    Gravida  2   Para  1   Term  1   Preterm  0   AB  0   Living  1     SAB  0   TAB  0   Ectopic  0   Multiple  0   Live Births  1           Past Medical History:  Diagnosis Date  . Hx of chlamydia infection   . Hx of gonorrhea   . Medical history non-contributory   . Syncope     Past Surgical History:  Procedure Laterality Date  . NO PAST SURGERIES      History reviewed. No pertinent family history.  Social History   Tobacco Use  . Smoking status: Never Smoker  . Smokeless tobacco: Never Used  Substance Use Topics  . Alcohol use: No    Frequency: Never  . Drug use: No    Allergies: No Known Allergies  Medications Prior to Admission  Medication Sig Dispense Refill Last Dose  . Prenatal Vit-Fe Fumarate-FA (PRENATAL COMPLETE) 14-0.4 MG TABS Take 2 tablets by mouth daily. 60 each 0     Review of Systems  Constitutional: Negative for chills, fatigue and fever.  Respiratory: Negative for shortness of breath.   Gastrointestinal: Positive for nausea and vomiting. Negative for abdominal pain.  Genitourinary: Negative for difficulty urinating, flank pain, vaginal bleeding, vaginal discharge and vaginal pain.  Musculoskeletal: Negative for back pain.  Neurological: Negative for dizziness, syncope, weakness, light-headedness and headaches.  All other systems reviewed  and are negative.  Physical Exam   Blood pressure 105/71, pulse 78, temperature 98 F (36.7 C), temperature source Oral, resp. rate 16, weight 66.2 kg, last menstrual period 11/21/2018, SpO2 99 %.  Physical Exam  Nursing note and vitals reviewed. Constitutional: She is oriented to person, place, and time. She appears well-developed and well-nourished.  Cardiovascular: Normal rate.  Respiratory: Effort normal.  GI: Soft. Bowel sounds are normal. She exhibits no distension. There is no abdominal tenderness. There is no rebound, no guarding and no CVA tenderness.  Genitourinary:    Genitourinary Comments: Not evaluated based on patient complaint   Neurological: She is alert and oriented to person, place, and time.  Skin: Skin is warm and dry.  Psychiatric: She has a normal mood and affect. Her behavior is normal. Judgment and thought content normal.    MAU Course/MDM  Procedures  --Patient endorses rx for antiemetic but her MyChart does not show as currently prescribed and she did not recognize any medication names for common-prescribed antiemetics --FHT 150 with bedside ultrasound. Patient informed that the ultrasound is considered a limited obstetric ultrasound and is not intended to be a complete ultrasound exam.  Patient also informed that the ultrasound is not being completed with the intent of assessing for fetal or placental anomalies  or any pelvic abnormalities.  Explained that the purpose of today's ultrasound is to assess for fetal heart rate.  Patient acknowledges the purpose of the exam and the limitations of the study.   --Positive nitrites, elevated leukocytes and WBCs. Urine culture ordered  Patient Vitals for the past 24 hrs:  BP Temp Temp src Pulse Resp SpO2 Weight  01/27/19 0937 105/71 98 F (36.7 C) Oral 78 16 99 % 66.2 kg    Results for orders placed or performed during the hospital encounter of 01/27/19 (from the past 24 hour(s))  Urinalysis, Routine w reflex  microscopic     Status: Abnormal   Collection Time: 01/27/19  9:37 AM  Result Value Ref Range   Color, Urine YELLOW YELLOW   APPearance CLEAR CLEAR   Specific Gravity, Urine 1.020 1.005 - 1.030   pH 7.0 5.0 - 8.0   Glucose, UA NEGATIVE NEGATIVE mg/dL   Hgb urine dipstick TRACE (A) NEGATIVE   Bilirubin Urine NEGATIVE NEGATIVE   Ketones, ur NEGATIVE NEGATIVE mg/dL   Protein, ur NEGATIVE NEGATIVE mg/dL   Nitrite POSITIVE (A) NEGATIVE   Leukocytes, UA MODERATE (A) NEGATIVE  Urinalysis, Microscopic (reflex)     Status: Abnormal   Collection Time: 01/27/19  9:37 AM  Result Value Ref Range   RBC / HPF 0-5 0 - 5 RBC/hpf   WBC, UA 11-20 0 - 5 WBC/hpf   Bacteria, UA MANY (A) NONE SEEN   Squamous Epithelial / LPF 0-5 0 - 5    Meds ordered this encounter  Medications  . ondansetron (ZOFRAN-ODT) disintegrating tablet 4 mg  . Doxylamine-Pyridoxine ER (BONJESTA) 20-20 MG TBCR    Sig: Take 1 tablet by mouth 2 (two) times daily at 8 am and 10 pm.    Dispense:  60 tablet    Refill:  0    Order Specific Question:   Supervising Provider    Answer:   Reva Bores [[id number]]   Assessment and Plan  --23 y.o. G2P1001  --N/v in first trimester. Tolerating PO prior to discharge --Rx Bonjesta to pharmacy --Discharge home in stable condition  Calvert Cantor, [place] 01/27/2019, 10:33 AM

## 2019-01-27 NOTE — MAU Note (Addendum)
Keep throwing up.  Can't keep nothing down, it's making her head hurt. Not on any meds, got bad a wk ago.

## 2019-01-27 NOTE — MAU Note (Signed)
Pt states that she was given nausea meds last visit but has not picked them up yet

## 2019-01-27 NOTE — MAU Note (Signed)
Urine in lab 

## 2019-01-27 NOTE — Discharge Instructions (Signed)
Morning Sickness    Morning sickness is when you feel sick to your stomach (nauseous) during pregnancy. You may feel sick to your stomach and throw up (vomit). You may feel sick in the morning, but you can feel this way at any time of day. Some women feel very sick to their stomach and cannot stop throwing up (hyperemesis gravidarum).  Follow these instructions at home:  Medicines   Take over-the-counter and prescription medicines only as told by your doctor. Do not take any medicines until you talk with your doctor about them first.   Taking multivitamins before getting pregnant can stop or lessen the harshness of morning sickness.  Eating and drinking   Eat dry toast or crackers before getting out of bed.   Eat 5 or 6 small meals a day.   Eat dry and bland foods like rice and baked potatoes.   Do not eat greasy, fatty, or spicy foods.   Have someone cook for you if the smell of food causes you to feel sick or throw up.   If you feel sick to your stomach after taking prenatal vitamins, take them at night or with a snack.   Eat protein when you need a snack. Nuts, yogurt, and cheese are good choices.   Drink fluids throughout the day.   Try ginger ale made with real ginger, ginger tea made from fresh grated ginger, or ginger candies.  General instructions   Do not use any products that have nicotine or tobacco in them, such as cigarettes and e-cigarettes. If you need help quitting, ask your doctor.   Use an air purifier to keep the air in your house free of smells.   Get lots of fresh air.   Try to avoid smells that make you feel sick.   Try:  ? Wearing a bracelet that is used for seasickness (acupressure wristband).  ? Going to a doctor who puts thin needles into certain body points (acupuncture) to improve how you feel.  Contact a doctor if:   You need medicine to feel better.   You feel dizzy or light-headed.   You are losing weight.  Get help right away if:   You feel very sick to your  stomach and cannot stop throwing up.   You pass out (faint).   You have very bad pain in your belly.  Summary   Morning sickness is when you feel sick to your stomach (nauseous) during pregnancy.   You may feel sick in the morning, but you can feel this way at any time of day.   Making some changes to what you eat may help your symptoms go away.  This information is not intended to replace advice given to you by your health care provider. Make sure you discuss any questions you have with your health care provider.  Document Released: 01/22/2005 Document Revised: 01/15/2017 Document Reviewed: 01/15/2017  Elsevier Interactive Patient Education  2019 [employer]

## 2019-01-29 ENCOUNTER — Other Ambulatory Visit: Payer: Self-pay | Admitting: Advanced Practice Midwife

## 2019-01-29 LAB — CULTURE, OB URINE: Culture: >=100000 — AB

## 2019-01-29 MED ORDER — CEPHALEXIN 500 MG PO CAPS: 500.0000 mg | ORAL_CAPSULE | Freq: Four times a day (QID) | ORAL | 2 refills | Status: DC

## 2019-01-29 NOTE — Progress Notes (Signed)
Rx Keflex based on urine culture results from MAU visit 01/27/2019. Will notify patient of results via active MyChart.  Clayton Bibles, CNM 01/29/19 10:16 AM

## 2019-03-15 ENCOUNTER — Other Ambulatory Visit: Payer: Self-pay

## 2019-03-15 ENCOUNTER — Encounter (HOSPITAL_COMMUNITY): Payer: Self-pay | Admitting: *Deleted

## 2019-03-15 ENCOUNTER — Inpatient Hospital Stay (HOSPITAL_COMMUNITY)
Admission: AD | Admit: 2019-03-15 | Discharge: 2019-03-15 | Disposition: A | Discharge status: Home / Self Care | Payer: Medicaid Other | Attending: Obstetrics and Gynecology | Admitting: Obstetrics and Gynecology

## 2019-03-15 DIAGNOSIS — Z79899 Other long term (current) drug therapy: Secondary | ICD-10-CM | POA: Diagnosis not present

## 2019-03-15 DIAGNOSIS — Z3A16 16 weeks gestation of pregnancy: Secondary | ICD-10-CM

## 2019-03-15 DIAGNOSIS — O219 Vomiting of pregnancy, unspecified: Secondary | ICD-10-CM | POA: Diagnosis not present

## 2019-03-15 LAB — URINALYSIS, ROUTINE W REFLEX MICROSCOPIC
BILIRUBIN URINE: NEGATIVE
Glucose, UA: NEGATIVE mg/dL
Hgb urine dipstick: NEGATIVE
Ketones, ur: 80 mg/dL — AB
Nitrite: NEGATIVE
Protein, ur: NEGATIVE mg/dL
SPECIFIC GRAVITY, URINE: 1.027 (ref 1.005–1.030)
pH: 5 (ref 5.0–8.0)

## 2019-03-15 MED ORDER — LACTATED RINGERS IV BOLUS
1000.0000 mL | Freq: Once | INTRAVENOUS | Status: AC
  Administered 2019-03-15: 1000 mL via INTRAVENOUS

## 2019-03-15 MED ORDER — PROMETHAZINE HCL 25 MG PO TABS: 12.5000 mg | ORAL_TABLET | Freq: Four times a day (QID) | ORAL | 0 refills | Status: DC | PRN

## 2019-03-15 MED ORDER — SODIUM CHLORIDE 0.9 % IV SOLN
8.0000 mg | Freq: Once | INTRAVENOUS | Status: AC
  Administered 2019-03-15: 8 mg via INTRAVENOUS
  Filled 2019-03-15: qty 4

## 2019-03-15 NOTE — MAU Provider Note (Signed)
History     CSN: [id number]  Arrival date and time: 03/15/19 1149   First Provider Initiated Contact with Patient 03/15/19 1302      Chief Complaint  Patient presents with  . Emesis  . Nausea   Sarah Quinn is a 23 y.o. G2P1001 at [redacted]w[redacted]d who presents today with nausea and vomiting. She states that she was using zofran, and it was helping. However it was causing severe constipation. She last took zofran about one week ago. Next visit 03/17/19  Emesis   This is a new problem. The current episode started in the past 7 days. The problem occurs 5 to 10 times per day. The emesis has an appearance of stomach contents. There has been no fever. Associated symptoms include diarrhea. Pertinent negatives include no chills or fever. Risk factors: pregnancy  Treatments tried: zofran  The treatment provided mild relief.    OB History    Gravida  2   Para  1   Term  1   Preterm  0   AB  0   Living  1     SAB  0   TAB  0   Ectopic  0   Multiple  0   Live Births  1           Past Medical History:  Diagnosis Date  . Hx of chlamydia infection   . Hx of gonorrhea   . Medical history non-contributory   . Syncope     Past Surgical History:  Procedure Laterality Date  . NO PAST SURGERIES      Family History  Problem Relation Age of Onset  . Healthy Mother   . Healthy Father     Social History   Tobacco Use  . Smoking status: Never Smoker  . Smokeless tobacco: Never Used  Substance Use Topics  . Alcohol use: No    Frequency: Never  . Drug use: Yes    Types: Marijuana    Comment: last smoked January 2020    Allergies: No Known Allergies  Medications Prior to Admission  Medication Sig Dispense Refill Last Dose  . cephALEXin (KEFLEX) 500 MG capsule Take 1 capsule (500 mg total) by mouth 4 (four) times daily. 28 capsule 2   . Doxylamine-Pyridoxine ER (BONJESTA) 20-20 MG TBCR Take 1 tablet by mouth 2 (two) times daily at 8 am and 10 pm. 60 tablet 0   .  Prenatal Vit-Fe Fumarate-FA (PRENATAL COMPLETE) 14-0.4 MG TABS Take 2 tablets by mouth daily. 60 each 0     Review of Systems  Constitutional: Negative for chills and fever.  Gastrointestinal: Positive for diarrhea and vomiting. Negative for constipation.  Genitourinary: Negative for dysuria, frequency, pelvic pain, vaginal bleeding and vaginal discharge.   Physical Exam   Blood pressure 105/70, pulse 93, temperature 98.3 F (36.8 C), temperature source Oral, resp. rate 19, height 5\' 6"  (1.676 m), weight 65.4 kg, last menstrual period 11/21/2018, SpO2 98 %.  Physical Exam  Nursing note and vitals reviewed. Constitutional: She is oriented to person, place, and time. She appears well-developed and well-nourished. No distress.  HENT:  Head: Normocephalic.  Cardiovascular: Normal rate.  Respiratory: Effort normal.  GI: Soft. There is no abdominal tenderness. There is no rebound.  Neurological: She is alert and oriented to person, place, and time.  Skin: Skin is warm and dry.  Psychiatric: She has a normal mood and affect.   FHT 141 with doppler    Results for orders placed  or performed during the hospital encounter of 03/15/19 (from the past 24 hour(s))  Urinalysis, Routine w reflex microscopic     Status: Abnormal   Collection Time: 03/15/19 12:16 PM  Result Value Ref Range   Color, Urine YELLOW YELLOW   APPearance HAZY (A) CLEAR   Specific Gravity, Urine 1.027 1.005 - 1.030   pH 5.0 5.0 - 8.0   Glucose, UA NEGATIVE NEGATIVE mg/dL   Hgb urine dipstick NEGATIVE NEGATIVE   Bilirubin Urine NEGATIVE NEGATIVE   Ketones, ur 80 (A) NEGATIVE mg/dL   Protein, ur NEGATIVE NEGATIVE mg/dL   Nitrite NEGATIVE NEGATIVE   Leukocytes,Ua SMALL (A) NEGATIVE   RBC / HPF 0-5 0 - 5 RBC/hpf   WBC, UA 11-20 0 - 5 WBC/hpf   Bacteria, UA FEW (A) NONE SEEN   Squamous Epithelial / LPF 11-20 0 - 5   Mucus PRESENT     MAU Course  Procedures  MDM Patient has had 2L of LR and 8mg  Zofran. IV. She  reports that she is feeling better. She is tolerating PO at this time.   Assessment and Plan   1. Nausea/vomiting in pregnancy   2. [redacted] weeks gestation of pregnancy    DC home Comfort measures reviewed  2nd Trimester precautions  RX: phenergan PRN #[id number]  Return to MAU as needed FU with OB as planned  Follow-up Information    Gynecology, [facility].   Specialty:  Obstetrics and Gynecology Contact information: [address] [place] [address] [phone]            Thressa Sheller DNP, CNM  03/15/19  1:19 PM

## 2019-03-15 NOTE — Discharge Instructions (Signed)
Morning Sickness    Morning sickness is when you feel sick to your stomach (nauseous) during pregnancy. You may feel sick to your stomach and throw up (vomit). You may feel sick in the morning, but you can feel this way at any time of day. Some women feel very sick to their stomach and cannot stop throwing up (hyperemesis gravidarum).  Follow these instructions at home:  Medicines   Take over-the-counter and prescription medicines only as told by your doctor. Do not take any medicines until you talk with your doctor about them first.   Taking multivitamins before getting pregnant can stop or lessen the harshness of morning sickness.  Eating and drinking   Eat dry toast or crackers before getting out of bed.   Eat 5 or 6 small meals a day.   Eat dry and bland foods like rice and baked potatoes.   Do not eat greasy, fatty, or spicy foods.   Have someone cook for you if the smell of food causes you to feel sick or throw up.   If you feel sick to your stomach after taking prenatal vitamins, take them at night or with a snack.   Eat protein when you need a snack. Nuts, yogurt, and cheese are good choices.   Drink fluids throughout the day.   Try ginger ale made with real ginger, ginger tea made from fresh grated ginger, or ginger candies.  General instructions   Do not use any products that have nicotine or tobacco in them, such as cigarettes and e-cigarettes. If you need help quitting, ask your doctor.   Use an air purifier to keep the air in your house free of smells.   Get lots of fresh air.   Try to avoid smells that make you feel sick.   Try:  ? Wearing a bracelet that is used for seasickness (acupressure wristband).  ? Going to a doctor who puts thin needles into certain body points (acupuncture) to improve how you feel.  Contact a doctor if:   You need medicine to feel better.   You feel dizzy or light-headed.   You are losing weight.  Get help right away if:   You feel very sick to your  stomach and cannot stop throwing up.   You pass out (faint).   You have very bad pain in your belly.  Summary   Morning sickness is when you feel sick to your stomach (nauseous) during pregnancy.   You may feel sick in the morning, but you can feel this way at any time of day.   Making some changes to what you eat may help your symptoms go away.  This information is not intended to replace advice given to you by your health care provider. Make sure you discuss any questions you have with your health care provider.  Document Released: 01/22/2005 Document Revised: 01/15/2017 Document Reviewed: 01/15/2017  Elsevier Interactive Patient Education  2019 [employer]

## 2019-03-15 NOTE — MAU Note (Signed)
Presents with c/o N/V, reports can't keep anything down.  States no appetite.  States has vomited 5x in 24 hours. Denies LOF or VB.

## 2019-04-01 ENCOUNTER — Other Ambulatory Visit: Payer: Self-pay

## 2019-04-01 ENCOUNTER — Encounter (HOSPITAL_COMMUNITY): Payer: Self-pay | Admitting: *Deleted

## 2019-04-01 ENCOUNTER — Inpatient Hospital Stay (HOSPITAL_COMMUNITY)
Admission: AD | Admit: 2019-04-01 | Discharge: 2019-04-01 | Disposition: A | Discharge status: Home / Self Care | Payer: Medicaid Other | Attending: Obstetrics & Gynecology | Admitting: Obstetrics & Gynecology

## 2019-04-01 DIAGNOSIS — M79604 Pain in right leg: Secondary | ICD-10-CM

## 2019-04-01 DIAGNOSIS — R102 Pelvic and perineal pain: Secondary | ICD-10-CM

## 2019-04-01 DIAGNOSIS — Z79899 Other long term (current) drug therapy: Secondary | ICD-10-CM | POA: Insufficient documentation

## 2019-04-01 DIAGNOSIS — O26892 Other specified pregnancy related conditions, second trimester: Secondary | ICD-10-CM | POA: Diagnosis not present

## 2019-04-01 DIAGNOSIS — M7918 Myalgia, other site: Secondary | ICD-10-CM | POA: Diagnosis not present

## 2019-04-01 DIAGNOSIS — M79605 Pain in left leg: Secondary | ICD-10-CM

## 2019-04-01 DIAGNOSIS — Z3A18 18 weeks gestation of pregnancy: Secondary | ICD-10-CM

## 2019-04-01 DIAGNOSIS — R109 Unspecified abdominal pain: Secondary | ICD-10-CM | POA: Diagnosis not present

## 2019-04-01 DIAGNOSIS — Z3492 Encounter for supervision of normal pregnancy, unspecified, second trimester: Secondary | ICD-10-CM

## 2019-04-01 DIAGNOSIS — Z87891 Personal history of nicotine dependence: Secondary | ICD-10-CM | POA: Diagnosis not present

## 2019-04-01 NOTE — MAU Provider Note (Addendum)
History     CSN: [id number]  Arrival date and time: 04/01/19 2022   First Provider Initiated Contact with Patient 04/01/19 2057      Chief Complaint  Patient presents with  . Abdominal Pain   HPI Sarah Quinn is a 23 y.o. G2P1001 at [redacted]w[redacted]d who presents to MAU with chief complaint of pain in her left groin. Patient endorses a single episode of "mild pain" in her left groin today around 5pm. Her pain lasted "less than one minute" and resolved without intervention. She denies pain upon arrival to MAU. Patient reports she has been diagnosed with ovarian cysts in the past and she was worried the pain was related to an ovarian cyst which would harm her pregnancy. She denies vaginal bleeding, abnormal vaginal discharge, abdominal tenderness, urinary symptoms, fever or recent illness.  Patient verbalizes to CNM that "I really just wanted to hear the baby's heartbeat and make sure everything was ok". She declines all lab work including UA and vaginal swabs.   OB History    Gravida  2   Para  1   Term  1   Preterm  0   AB  0   Living  1     SAB  0   TAB  0   Ectopic  0   Multiple  0   Live Births  1           Past Medical History:  Diagnosis Date  . Hx of chlamydia infection   . Hx of gonorrhea   . Medical history non-contributory   . Syncope     Past Surgical History:  Procedure Laterality Date  . NO PAST SURGERIES      Family History  Problem Relation Age of Onset  . Healthy Mother   . Healthy Father     Social History   Tobacco Use  . Smoking status: Never Smoker  . Smokeless tobacco: Never Used  Substance Use Topics  . Alcohol use: No    Frequency: Never  . Drug use: Yes    Types: Marijuana    Comment: last smoked January 2020    Allergies: No Known Allergies  Medications Prior to Admission  Medication Sig Dispense Refill Last Dose  . Prenatal Vit-Fe Fumarate-FA (PRENATAL COMPLETE) 14-0.4 MG TABS Take 2 tablets by mouth daily. 60 each  0 04/01/2019 at Unknown time  . cephALEXin (KEFLEX) 500 MG capsule Take 1 capsule (500 mg total) by mouth 4 (four) times daily. 28 capsule 2   . Doxylamine-Pyridoxine ER (BONJESTA) 20-20 MG TBCR Take 1 tablet by mouth 2 (two) times daily at 8 am and 10 pm. 60 tablet 0   . promethazine (PHENERGAN) 25 MG tablet Take 0.5-1 tablets (12.5-25 mg total) by mouth every 6 (six) hours as needed for nausea or vomiting. 30 tablet 0 More than a month at Unknown time    Review of Systems  Constitutional: Negative for chills, fatigue and fever.  Gastrointestinal: Negative for abdominal pain, nausea and vomiting.  Genitourinary: Negative for difficulty urinating, dysuria, flank pain, vaginal bleeding, vaginal discharge and vaginal pain.  Musculoskeletal: Negative for back pain.       Left groin pain  All other systems reviewed and are negative.  Physical Exam   Blood pressure 107/67, pulse 96, temperature (!) 97.5 F (36.4 C), resp. rate 18, height 5\' 6"  (1.676 m), weight 66.7 kg, last menstrual period 11/21/2018.  Physical Exam  Nursing note and vitals reviewed. Constitutional: She is oriented  to person, place, and time. She appears well-developed and well-nourished.  Cardiovascular: Normal rate.  Respiratory: Effort normal.  GI: She exhibits no distension. There is no abdominal tenderness. There is no rigidity, no rebound, no guarding and no CVA tenderness.  Gravid  Genitourinary:    Genitourinary Comments: Declined by patient   Neurological: She is alert and oriented to person, place, and time.  Skin: Skin is warm and dry.  Psychiatric: She has a normal mood and affect. Her behavior is normal. Judgment and thought content normal.    MAU Course  Procedures  --Discussed round ligament pain and musculoskeletal pain in second trimester. Reviewed common interventions including gentle stretching, prioritize PO hydration with water, sleep with 2-3 pillows between knees to aid abduction overnight,  consider PO Tylenol 650mg  q 4 PRN --Patient declined all further workup after CNM assessment  Patient Vitals for the past 24 hrs:  BP Temp Pulse Resp Height Weight  04/01/19 2033 107/67 - 96 - - -  04/01/19 2032 - (!) 97.5 F (36.4 C) - 18 5\' 6"  (1.676 m) 66.7 kg     Assessment and Plan  --23 y.o. G2P1001 at [redacted]w[redacted]d  --FHT 135 by Doppler --Round ligament and musculoskeletal pain in second trimester --Discharge home in stable condition  F/U: Next OB appt at 20 weeks  Calvert Cantor, CNM 04/01/2019, 9:20 PM

## 2019-04-01 NOTE — MAU Note (Signed)
Hx of "cyst" L groin since Dec 2019. Pt states is not visible. Area is starting to hurt and worse when laying down. Denies vag bleeding or d/c

## 2019-04-01 NOTE — Discharge Instructions (Signed)
Social Distancing FAQ: How It Helps Prevent COVID-19 (Coronavirus) and Steps We Can Take to Protect Ourselves There are many things we can do to prevent the spread of COVID-19 (coronavirus): washing our hands, coughing into our elbows, avoiding touching our faces, staying home if we're feeling sick and social distancing. But what is social distancing? These frequently asked questions explain how to practice social distancing in order to protect yourself, your loved ones and your community. General Information About Social Distancing Q: What is social distancing? A: Social distancing is the practice of purposefully reducing close contact between people. According to the CDC, social distancing means: . Remaining out of "congregate settings" as much as possible. . Avoiding mass gatherings. . Maintaining distance of about 6 feet from others when possible. Q: Why is social distancing important? A: Social distancing is crucial for preventing the spread of contagious illnesses such as COVID-19 (coronavirus). COVID-19 can spread through coughing, sneezing and close contact. By minimizing the amount of close contact we have with others, we reduce our chances of catching the virus and spreading it to our loved ones and within our community. Q: Who is social distancing important for? A: Social distancing is important for all of us, but those of us who are at higher risk of serious complications caused by COVID-19 should be especially cautious about social distancing. People who are at high risk of complications include: . Older adults. . People who have serious chronic medical conditions like heart disease, diabetes and lung disease. Q: What is "flattening the curve"? What does it have to do with social distancing? A: "Flattening the curve" refers to reducing the number of people who are sick at one time. If there are high surges in the number of COVID-19 cases all at once, health care systems and resources  could potentially become overwhelmed. Efforts that help stop COVID-19 from spreading rapidly - like social distancing - help keep the number of people who are sick at one time as low as possible. Q: When should I start practicing social distancing? A: The best time to begin social distancing is before an illness like COVID-19 becomes widespread throughout your community. Each community's situation is unique, so it is important to follow the guidance of local government, health departments and health care providers. Currently in [place], gatherings of more than 10 people are banned. For the latest information on COVID-19 policies in [place], visit the [place] Department of Health and Human Services website and view news releases and executive orders. The advice below is applicable if you are symptom-free and have reasonable confidence that you have not had exposure to COVID-19. Always follow the guidance of local government, health departments and your health care providers. Visiting Public Spaces Q: How can I practice social distancing in the workplace? A: When possible, keeping about 6 feet of distance between yourself and others is key. It's also important to practice other preventative measures such as washing hands, avoiding touching your face, coughing into your elbow and staying home if you feel sick. Depending on your job and your community's situation, working from home may be an option. Always follow local guidance. Q: How can my child practice social distancing at school or at college?  A: Many schools and universities in the [place] have postponed in-person classes; currently in [place], K-12 schools have been closed until May 15 (this is subject to change). It's important to follow local guidance, as each community's situation is unique. It's important for people   of all ages to follow preventative measures, including staying about 6 feet away from others, avoiding  touching your face, washing your hands and coughing into your elbow. Q: Should I be concerned about going to the grocery store? A: In any place where large numbers of people gather, there is potential risk for disease transmission. When you visit the grocery store, keep about 6 feet between yourself and others and use prevention techniques like avoiding touching your face and washing your hands. If possible, visit the store at times when there are likely to be fewer people shopping. Q: Should I take public transportation? A: If you have the option, driving yourself, walking to work or working from home can help reduce the number of people who are using public transportation, which benefits you and your community. In any of these situations, it's very important to keep distance between yourself and others in addition to practicing other preventative measures. Q: Should I stop visiting restaurants and bars? A: Currently in [place], restaurants and bars have been closed until further notice. Always follow local guidance. Avoiding public places as much as possible helps prevent diseases from spreading. If dining out is a non-essential activity, then it is generally in the best interest of you and your loved ones to avoid it. Q: Can I still go to the gym? A: Currently in [place], gyms have been closed until further notice. Always follow local guidance. Alternatives could include exercising in your home or yard or walking through your neighborhood. If you do go to the gym, wipe down and sanitize your equipment, keep distance between yourself and others, avoid touching other people and practice other preventative techniques.  Q: What about events and places where many people gather, such as concerts, festivals, sporting events and churches? A: Risk of disease transmission is much higher in large groups of people. Effective March 25 at 5 PM in [place], meetings of over 10 people are prohibited  in order to prevent COVID-19 from spreading rapidly. Avoiding these gatherings are generally in the best interest of protecting yourself, your loved ones and your community. Always follow local guidance. Meeting with Others Q: Should I stop visiting my elderly relatives and friends?  A: Older adults are at high risk of serious complications from COVID-19. Limiting their exposure as much as possible to those who may be sick or who may be carrying the disease is crucial. This is a great opportunity to try other methods of connecting, such as over the phone or through a video chat. Q: What about social distancing with other people in my household? A: Avoiding close contact within a household is almost impossible, and social distancing is mainly focused on large groups. However, if someone in your household is sick, it's important to minimize close contact with them as much as is reasonable. Q: Should I stop meeting up with 1 or 2 friends? Should I stop dating? A: While meeting up with another person who also is symptom-free may be alright in some situations, keep in mind that risk of disease transmission is higher in public places. Now may be a good time to consider other methods of connecting with others, such as over the phone or through a video chat. Q: Can I have a small group of my extended family and/or friends over to my house? A: If possible, it's best to postpone these kinds of gatherings and look for alternative ways to connect. Avoiding non-essential gatherings is important for preventing the spread   of disease. Q: Should my family and friends cancel big gathering events like weddings and birthday parties? A: Effective March 25 at 5 PM in [place], meetings of over 10 people are prohibited in order to prevent COVID-19 from spreading rapidly. Always follow local guidance. While it's difficult to postpone important events like these, it's also very important to protect our loved ones -  especially our loved ones who are most vulnerable. It may be best to postpone or alter your plans.  Q: If I'm avoiding in-person gatherings, how can I stay connected to others? A: There are many ways you can connect with friends: phone calls, text messages, emails and video chats are all great virtual options. While physical social distancing is important for our health, so is social interaction - trying alternative ways to stay connected is a good way to take care of your emotional health. If You Are Experiencing Symptoms Q: How should I approach social distancing if I start to feel sick? A: If you begin to experience symptoms, it's important to stay home and distance yourself from others. Always follow the guidance of your health care providers and local government. Q: Can I have visitors while I am in quarantine? A: Having visitors should be avoided as much as possible. Quarantining is an important way to protect your loved ones and community from picking up the disease; visitors are at high risk of catching the illness from you. Q: Can I go outside in my yard if I am in quarantine? A: Depending on where you live and your community's situation, going outside and getting some fresh air in your yard may be safe. Always follow the guidance of your health care providers and local government.   

## 2019-04-19 ENCOUNTER — Other Ambulatory Visit: Payer: Self-pay

## 2019-04-19 ENCOUNTER — Encounter (HOSPITAL_COMMUNITY): Payer: Self-pay

## 2019-04-19 ENCOUNTER — Inpatient Hospital Stay (HOSPITAL_COMMUNITY)
Admission: AD | Admit: 2019-04-19 | Discharge: 2019-04-19 | Disposition: A | Discharge status: Home / Self Care | Payer: Medicaid Other | Attending: Obstetrics and Gynecology | Admitting: Obstetrics and Gynecology

## 2019-04-19 DIAGNOSIS — O26892 Other specified pregnancy related conditions, second trimester: Secondary | ICD-10-CM

## 2019-04-19 DIAGNOSIS — Z3A21 21 weeks gestation of pregnancy: Secondary | ICD-10-CM

## 2019-04-19 DIAGNOSIS — O4692 Antepartum hemorrhage, unspecified, second trimester: Secondary | ICD-10-CM | POA: Insufficient documentation

## 2019-04-19 DIAGNOSIS — Z79899 Other long term (current) drug therapy: Secondary | ICD-10-CM | POA: Insufficient documentation

## 2019-04-19 DIAGNOSIS — B9689 Other specified bacterial agents as the cause of diseases classified elsewhere: Secondary | ICD-10-CM | POA: Diagnosis not present

## 2019-04-19 DIAGNOSIS — N898 Other specified noninflammatory disorders of vagina: Secondary | ICD-10-CM | POA: Diagnosis not present

## 2019-04-19 DIAGNOSIS — O23592 Infection of other part of genital tract in pregnancy, second trimester: Secondary | ICD-10-CM | POA: Insufficient documentation

## 2019-04-19 DIAGNOSIS — R300 Dysuria: Secondary | ICD-10-CM

## 2019-04-19 DIAGNOSIS — O23599 Infection of other part of genital tract in pregnancy, unspecified trimester: Secondary | ICD-10-CM

## 2019-04-19 LAB — URINALYSIS, ROUTINE W REFLEX MICROSCOPIC
Bilirubin Urine: NEGATIVE
Glucose, UA: 50 mg/dL — AB
Ketones, ur: NEGATIVE mg/dL
Nitrite: NEGATIVE
Protein, ur: NEGATIVE mg/dL
Specific Gravity, Urine: 1.018 (ref 1.005–1.030)
pH: 7 (ref 5.0–8.0)

## 2019-04-19 LAB — WET PREP, GENITAL
Sperm: NONE SEEN
Trich, Wet Prep: NONE SEEN
Yeast Wet Prep HPF POC: NONE SEEN

## 2019-04-19 MED ORDER — METRONIDAZOLE 0.75 % VA GEL: 1.0000 | Freq: Every day | VAGINAL | 0 refills | Status: DC

## 2019-04-19 NOTE — Discharge Instructions (Signed)
Bacterial Vaginosis    Bacterial vaginosis is a vaginal infection that occurs when the normal balance of bacteria in the vagina is disrupted. It results from an overgrowth of certain bacteria. This is the most common vaginal infection among women ages 15-44.  Because bacterial vaginosis increases your risk for STIs (sexually transmitted infections), getting treated can help reduce your risk for chlamydia, gonorrhea, herpes, and HIV (human immunodeficiency virus). Treatment is also important for preventing complications in pregnant women, because this condition can cause an early (premature) delivery.  What are the causes?  This condition is caused by an increase in harmful bacteria that are normally present in small amounts in the vagina. However, the reason that the condition develops is not fully understood.  What increases the risk?  The following factors may make you more likely to develop this condition:  · Having a new sexual partner or multiple sexual partners.  · Having unprotected sex.  · Douching.  · Having an intrauterine device (IUD).  · Smoking.  · Drug and alcohol abuse.  · Taking certain antibiotic medicines.  · Being pregnant.  You cannot get bacterial vaginosis from toilet seats, bedding, swimming pools, or contact with objects around you.  What are the signs or symptoms?  Symptoms of this condition include:  · Grey or white vaginal discharge. The discharge can also be watery or foamy.  · A fish-like odor with discharge, especially after sexual intercourse or during menstruation.  · Itching in and around the vagina.  · Burning or pain with urination.  Some women with bacterial vaginosis have no signs or symptoms.  How is this diagnosed?  This condition is diagnosed based on:  · Your medical history.  · A physical exam of the vagina.  · Testing a sample of vaginal fluid under a microscope to look for a large amount of bad bacteria or abnormal cells. Your health care provider may use a cotton swab or  a small wooden spatula to collect the sample.  How is this treated?  This condition is treated with antibiotics. These may be given as a pill, a vaginal cream, or a medicine that is put into the vagina (suppository). If the condition comes back after treatment, a second round of antibiotics may be needed.  Follow these instructions at home:  Medicines  · Take over-the-counter and prescription medicines only as told by your health care provider.  · Take or use your antibiotic as told by your health care provider. Do not stop taking or using the antibiotic even if you start to feel better.  General instructions  · If you have a female sexual partner, tell her that you have a vaginal infection. She should see her health care provider and be treated if she has symptoms. If you have a female sexual partner, he does not need treatment.  · During treatment:  ? Avoid sexual activity until you finish treatment.  ? Do not douche.  ? Avoid alcohol as directed by your health care provider.  ? Avoid breastfeeding as directed by your health care provider.  · Drink enough water and fluids to keep your urine clear or pale yellow.  · Keep the area around your vagina and rectum clean.  ? Wash the area daily with warm water.  ? Wipe yourself from front to back after using the toilet.  · Keep all follow-up visits as told by your health care provider. This is important.  How is this prevented?  · Do not   douche.  · Wash the outside of your vagina with warm water only.  · Use protection when having sex. This includes latex condoms and dental dams.  · Limit how many sexual partners you have. To help prevent bacterial vaginosis, it is best to have sex with just one partner (monogamous).  · Make sure you and your sexual partner are tested for STIs.  · Wear cotton or cotton-lined underwear.  · Avoid wearing tight pants and pantyhose, especially during summer.  · Limit the amount of alcohol that you drink.  · Do not use any products that contain  nicotine or tobacco, such as cigarettes and e-cigarettes. If you need help quitting, ask your health care provider.  · Do not use illegal drugs.  Where to find more information  · Centers for Disease Control and Prevention: www.cdc.gov/std  · American Sexual Health Association (ASHA): www.ashastd.org  · U.S. Department of Health and Human Services, Office on Women's Health: www.womenshealth.gov/ or https://www.womenshealth.gov/a-z-topics/bacterial-vaginosis  Contact a health care provider if:  · Your symptoms do not improve, even after treatment.  · You have more discharge or pain when urinating.  · You have a fever.  · You have pain in your abdomen.  · You have pain during sex.  · You have vaginal bleeding between periods.  Summary  · Bacterial vaginosis is a vaginal infection that occurs when the normal balance of bacteria in the vagina is disrupted.  · Because bacterial vaginosis increases your risk for STIs (sexually transmitted infections), getting treated can help reduce your risk for chlamydia, gonorrhea, herpes, and HIV (human immunodeficiency virus). Treatment is also important for preventing complications in pregnant women, because the condition can cause an early (premature) delivery.  · This condition is treated with antibiotic medicines. These may be given as a pill, a vaginal cream, or a medicine that is put into the vagina (suppository).  This information is not intended to replace advice given to you by your health care provider. Make sure you discuss any questions you have with your health care provider.  Document Released: 12/15/2005 Document Revised: 04/20/2017 Document Reviewed: 08/30/2016  Elsevier Interactive Patient Education © 2019 [employer]vier Inc.

## 2019-04-19 NOTE — MAU Provider Note (Signed)
History     CSN: [id number]  Arrival date and time: 04/19/19 1029   First Provider Initiated Contact with Patient 04/19/19 1121      Chief Complaint  Patient presents with  . Vaginal Bleeding  . Vaginal Discharge   Sarah Quinn is a 23 y.o. G2P1001 at [redacted]w[redacted]d who presents for Vaginal Bleeding and Vaginal Discharge.  She reports she had "abdominal pain or pressure," last night while laying down.  She states that she attempted to go to the bathroom and noted a milky white discharge and "a little tiny speck of blood...like I could barely see it."  She denies recent sexual activity.  She states she has been having some intermittent pain with urination as well as "going to pee and only a little comes out."  Patient endorses fetal movement.       OB History    Gravida  2   Para  1   Term  1   Preterm  0   AB  0   Living  1     SAB  0   TAB  0   Ectopic  0   Multiple  0   Live Births  1           Past Medical History:  Diagnosis Date  . Hx of chlamydia infection   . Hx of gonorrhea   . Syncope     Past Surgical History:  Procedure Laterality Date  . NO PAST SURGERIES      Family History  Problem Relation Age of Onset  . Healthy Mother   . Healthy Father     Social History   Tobacco Use  . Smoking status: Never Smoker  . Smokeless tobacco: Never Used  Substance Use Topics  . Alcohol use: No    Frequency: Never  . Drug use: Yes    Types: Marijuana    Comment: last smoked January 2020    Allergies: No Known Allergies  Medications Prior to Admission  Medication Sig Dispense Refill Last Dose  . cephALEXin (KEFLEX) 500 MG capsule Take 1 capsule (500 mg total) by mouth 4 (four) times daily. 28 capsule 2   . Doxylamine-Pyridoxine ER (BONJESTA) 20-20 MG TBCR Take 1 tablet by mouth 2 (two) times daily at 8 am and 10 pm. 60 tablet 0   . Prenatal Vit-Fe Fumarate-FA (PRENATAL COMPLETE) 14-0.4 MG TABS Take 2 tablets by mouth daily. 60 each 0  04/01/2019 at Unknown time  . promethazine (PHENERGAN) 25 MG tablet Take 0.5-1 tablets (12.5-25 mg total) by mouth every 6 (six) hours as needed for nausea or vomiting. 30 tablet 0 More than a month at Unknown time    Review of Systems  Constitutional: Negative for chills and fever.  Respiratory: Negative for cough and shortness of breath.   Gastrointestinal: Positive for abdominal pain. Negative for constipation, diarrhea, nausea and vomiting.  Genitourinary: Positive for difficulty urinating ("barely comes out."), dysuria (Started 2-3 days ago, but not constant.), frequency, urgency and vaginal discharge (Milky white started yesterday.). Negative for vaginal bleeding (Speck of blood with wiping.).  Neurological: Negative for dizziness, light-headedness and headaches.   Physical Exam   Blood pressure 103/69, pulse 87, temperature 98.5 F (36.9 C), temperature source Oral, resp. rate 16, weight 65.4 kg, last menstrual period 11/21/2018, SpO2 98 %.  Physical Exam  Constitutional: She is oriented to person, place, and time. She appears well-developed and well-nourished.  HENT:  Head: Normocephalic and atraumatic.  Eyes: Conjunctivae are  normal.  Neck: Normal range of motion.  Cardiovascular: Normal rate.  Respiratory: Effort normal.  GI: Soft.  Genitourinary: Cervix exhibits friability. Cervix exhibits no motion tenderness and no discharge.    Vaginal discharge present.     No vaginal bleeding.  No bleeding in the vagina.    Genitourinary Comments: Speculum Exam: -Vaginal Vault: Pink mucosa.  Moderate amt white frothy discharge -wet prep collected -Cervix:Pink, no lesions, cysts, or polyps.  Friable. Appears closed. No active bleeding  from os-GC/CT collected -Bimanual Exam: Closed/Long Uterine size difficult assess   Musculoskeletal: Normal range of motion.  Neurological: She is alert and oriented to person, place, and time.  Skin: Skin is warm and dry.  Psychiatric: She has a normal  mood and affect. Her behavior is normal.    MAU Course  Procedures Results for orders placed or performed during the hospital encounter of 04/19/19 (from the past 24 hour(s))  Urinalysis, Routine w reflex microscopic     Status: Abnormal   Collection Time: 04/19/19 11:12 AM  Result Value Ref Range   Color, Urine YELLOW YELLOW   APPearance HAZY (A) CLEAR   Specific Gravity, Urine 1.018 1.005 - 1.030   pH 7.0 5.0 - 8.0   Glucose, UA 50 (A) NEGATIVE mg/dL   Hgb urine dipstick SMALL (A) NEGATIVE   Bilirubin Urine NEGATIVE NEGATIVE   Ketones, ur NEGATIVE NEGATIVE mg/dL   Protein, ur NEGATIVE NEGATIVE mg/dL   Nitrite NEGATIVE NEGATIVE   Leukocytes,Ua LARGE (A) NEGATIVE   RBC / HPF 6-10 0 - 5 RBC/hpf   WBC, UA 6-10 0 - 5 WBC/hpf   Bacteria, UA RARE (A) NONE SEEN   Squamous Epithelial / LPF 11-20 0 - 5   Mucus PRESENT   Wet prep, genital     Status: Abnormal   Collection Time: 04/19/19 11:33 AM  Result Value Ref Range   Yeast Wet Prep HPF POC NONE SEEN NONE SEEN   Trich, Wet Prep NONE SEEN NONE SEEN   Clue Cells Wet Prep HPF POC PRESENT (A) NONE SEEN   WBC, Wet Prep HPF POC MANY (A) NONE SEEN   Sperm NONE SEEN     MDM Pelvic Exam with cultures Labs: UA, Wet prep, and GC/CT  Assessment and Plan  23 year old G2P1001 at 21.2 weeks Vaginal Discharge Dysuria  -Exam findings discussed -Wet prep collected and sent -UA pending -Will await results.   Follow Up (12:18 PM) Bacterial Vaginosis   -Wet prep returns significant for clue cells -Results discussed with patient -Informed that urine will be sent for culture.  Results to return in 2-3 days as well as GC/CT. -Rx for Metrogel 0.75% PV QHS x 5days sent to pharmacy on file.  -No questions or concerns. -Encouraged to call or return to MAU if symptoms worsen or with the onset of new symptoms. -Discharged to home in stable condition  Cherre RobinsJessica L Willene Holian MSN, CNM 04/19/2019, 11:21 AM

## 2019-04-19 NOTE — MAU Note (Signed)
Last night she felt abd was tight, was not painful.  Didn't last long. Went to the bathroom, felt pressure, when she wiped she saw a "speck " of blood.  No further bleeding. Has a milky d/c. Having frequency and urgency, then barely pees.sometimes hurts or has burning when pees.

## 2019-04-20 LAB — GC/CHLAMYDIA PROBE AMP (~~LOC~~) NOT AT ARMC
Chlamydia: NEGATIVE
Neisseria Gonorrhea: NEGATIVE

## 2019-06-15 ENCOUNTER — Other Ambulatory Visit: Payer: Self-pay

## 2019-06-15 ENCOUNTER — Encounter (HOSPITAL_COMMUNITY): Payer: Self-pay

## 2019-06-15 ENCOUNTER — Inpatient Hospital Stay (HOSPITAL_COMMUNITY)
Admission: AD | Admit: 2019-06-15 | Discharge: 2019-06-15 | Disposition: A | Discharge status: Home / Self Care | Payer: Medicaid Other | Source: Ambulatory Visit | Attending: Obstetrics & Gynecology | Admitting: Obstetrics & Gynecology

## 2019-06-15 DIAGNOSIS — N888 Other specified noninflammatory disorders of cervix uteri: Secondary | ICD-10-CM | POA: Diagnosis not present

## 2019-06-15 DIAGNOSIS — Z79899 Other long term (current) drug therapy: Secondary | ICD-10-CM | POA: Insufficient documentation

## 2019-06-15 DIAGNOSIS — Z3A29 29 weeks gestation of pregnancy: Secondary | ICD-10-CM | POA: Diagnosis not present

## 2019-06-15 DIAGNOSIS — Z792 Long term (current) use of antibiotics: Secondary | ICD-10-CM | POA: Diagnosis not present

## 2019-06-15 DIAGNOSIS — O4693 Antepartum hemorrhage, unspecified, third trimester: Secondary | ICD-10-CM | POA: Insufficient documentation

## 2019-06-15 LAB — URINALYSIS, ROUTINE W REFLEX MICROSCOPIC
Bilirubin Urine: NEGATIVE
Glucose, UA: NEGATIVE mg/dL
Ketones, ur: NEGATIVE mg/dL
Nitrite: NEGATIVE
Protein, ur: NEGATIVE mg/dL
Specific Gravity, Urine: 1.012 (ref 1.005–1.030)
pH: 7 (ref 5.0–8.0)

## 2019-06-15 LAB — POCT FERN TEST: POCT Fern Test: NEGATIVE

## 2019-06-15 MED ORDER — SILVER NITRATE-POT NITRATE 75-25 % EX MISC
5.0000 | Freq: Once | CUTANEOUS | Status: DC
  Filled 2019-06-15: qty 1

## 2019-06-15 NOTE — MAU Note (Signed)
Got up around 0400 and felt wet.  Went to the restroom and had some light spotting.  Last intercourse in December.  No contractions or gush of fluid.  + FM.

## 2019-06-15 NOTE — Discharge Instructions (Signed)
Vaginal Bleeding During Pregnancy, Third Trimester ° °A small amount of bleeding (spotting) from the vagina is common during pregnancy. Sometimes the bleeding is normal and is not a problem, and sometimes it is a sign of something serious. Tell your doctor about any bleeding from your vagina right away. °Follow these instructions at home: °Activity °· Follow your doctor's instructions about how active you can be. Your doctor may recommend that you: °? Stay in bed and only get up to use the bathroom. °? Continue light activity. °· If needed, make plans for someone to help you with your normal activities. °· Ask your doctor if it is safe for you to drive. °· Do not lift anything that is heavier than 10 lb (4.5 kg) until your doctor says that this is safe. °· Do not have sex or orgasms until your doctor says that this is safe. °Medicines °· Take over-the-counter and prescription medicines only as told by your doctor. °· Do not take aspirin. It can cause bleeding. °General instructions °· Watch your condition for any changes. °· Write down: °? The number of pads you use each day. °? How often you change pads. °? How soaked (saturated) your pads are. °· Do not use tampons. °· Do not douche. °· If you pass any tissue from your vagina, save the tissue to show your doctor. °· Keep all follow-up visits as told by your doctor. This is important. °Contact a doctor if: °· You have vaginal bleeding at any time during pregnancy. °· You have cramps. °· You have a fever. °Get help right away if: °· You have very bad cramps. °· You have very bad pain in your back or belly (abdomen). °· You have a gush of fluid from your vagina. °· You pass large clots or a lot of tissue from your vagina. °· Your bleeding gets worse. °· You feel light-headed or weak. °· You pass out (faint). °· Your baby is moving less than usual, or not moving at all. °Summary °· Tell your doctor about any bleeding from your vagina right away. °· Follow instructions  from your doctor about how active you can be. You may need someone to help you with your normal activities. °This information is not intended to replace advice given to you by your health care provider. Make sure you discuss any questions you have with your health care provider. °Document Released: 05/01/2014 Document Revised: 03/18/2017 Document Reviewed: 03/18/2017 °Elsevier Interactive Patient Education © 2019 [employer] Inc. ° °

## 2019-06-15 NOTE — MAU Provider Note (Addendum)
Chief Complaint:  Vaginal Bleeding   First Provider Initiated Contact with Patient 06/15/19 [phone]      HPI: Sarah PyoDeerika L Quinn is a 23 y.o. G2P1001 at 2329w3dwho presents to maternity admissions reporting vaginal bleeding tonight. .No recent intercourse She reports good fetal movement, denies LOF, vaginal itching/burning, urinary symptoms, h/a, dizziness, n/v, diarrhea, constipation or fever/chills.    This is her second visit for this problem  Bleeding on 04/19/2019 was lighter No prenatal records available  RN note: Got up around 0400 and felt wet.  Went to the restroom and had some light spotting.  Last intercourse in December.  No contractions or gush of fluid.  + FM.   Past Medical History: Past Medical History:  Diagnosis Date  . Hx of chlamydia infection   . Hx of gonorrhea   . Syncope     Past obstetric history: OB History  Gravida Para Term Preterm AB Living  2 1 1  0 0 1  SAB TAB Ectopic Multiple Live Births  0 0 0 0 1    # Outcome Date GA Lbr Len/2nd Weight Sex Delivery Anes PTL Lv  2 Current           1 Term 05/05/10    Judie PetitM Vag-Spont   LIV    Past Surgical History: Past Surgical History:  Procedure Laterality Date  . NO PAST SURGERIES      Family History: Family History  Problem Relation Age of Onset  . Healthy Mother   . Healthy Father     Social History: Social History   Tobacco Use  . Smoking status: Never Smoker  . Smokeless tobacco: Never Used  Substance Use Topics  . Alcohol use: No    Frequency: Never  . Drug use: Yes    Types: Marijuana    Comment: last smoked January 2020    Allergies: No Known Allergies  Meds:  Medications Prior to Admission  Medication Sig Dispense Refill Last Dose  . Prenatal Vit-Fe Fumarate-FA (PRENATAL COMPLETE) 14-0.4 MG TABS Take 2 tablets by mouth daily. 60 each 0 06/15/2019 at Unknown time  . cephALEXin (KEFLEX) 500 MG capsule Take 1 capsule (500 mg total) by mouth 4 (four) times daily. 28 capsule 2   .  Doxylamine-Pyridoxine ER (BONJESTA) 20-20 MG TBCR Take 1 tablet by mouth 2 (two) times daily at 8 am and 10 pm. 60 tablet 0   . metroNIDAZOLE (METROGEL VAGINAL) 0.75 % vaginal gel Place 1 Applicatorful vaginally at bedtime. Insert one applicator, at bedtime, for 5 nights. 70 g 0   . promethazine (PHENERGAN) 25 MG tablet Take 0.5-1 tablets (12.5-25 mg total) by mouth every 6 (six) hours as needed for nausea or vomiting. 30 tablet 0     I have reviewed patient's Past Medical Hx, Surgical Hx, Family Hx, Social Hx, medications and allergies.   ROS:  Review of Systems  Constitutional: Negative for chills and fever.  Respiratory: Negative for shortness of breath.   Gastrointestinal: Negative for abdominal pain, constipation, diarrhea and nausea.  Genitourinary: Positive for vaginal bleeding. Negative for pelvic pain.  Musculoskeletal: Positive for back pain.   Other systems negative  Physical Exam   Patient Vitals for the past 24 hrs:  BP Temp Pulse Resp Weight  06/15/19 0437 106/62 99.1 F (37.3 C) 90 17 73.2 kg   Constitutional: Well-developed, well-nourished female in no acute distress.  Cardiovascular: normal rate and rhythm Respiratory: normal effort, clear to auscultation bilaterally GI: Abd soft, non-tender, gravid appropriate for gestational  age.   No rebound or guarding. MS: Extremities nontender, no edema, normal ROM Neurologic: Alert and oriented x 4.  GU: Neg CVAT.  PELVIC EXAM: Cervix pink, visually closed, with friability to touch of swab at the 5:00 position, pea-sized ?lesion appears similar to polyp.   Located just uncer and almost behind 5:00 position.  Attempted photo unsuccessfully.  Unable to adequately visualize lesion for Silver nitrate, and on second look, bleeding had mostly stop so I did not cauterize with silver nitrate.    scant clear discharge (tested for fern, negative), vaginal walls and external genitalia normal  Cervix exhibits friability.        Genitourinary Comments: Polypoid tissue?  Friable, ?appearance of polyp    FHT:  Baseline 140 , moderate variability, accelerations present, no decelerations Contractions:  Rare   Labs: Results for orders placed or performed during the hospital encounter of 06/15/19 (from the past 24 hour(s))  Urinalysis, Routine w reflex microscopic     Status: Abnormal   Collection Time: 06/15/19  4:42 AM  Result Value Ref Range   Color, Urine YELLOW YELLOW   APPearance CLEAR CLEAR   Specific Gravity, Urine 1.012 1.005 - 1.030   pH 7.0 5.0 - 8.0   Glucose, UA NEGATIVE NEGATIVE mg/dL   Hgb urine dipstick LARGE (A) NEGATIVE   Bilirubin Urine NEGATIVE NEGATIVE   Ketones, ur NEGATIVE NEGATIVE mg/dL   Protein, ur NEGATIVE NEGATIVE mg/dL   Nitrite NEGATIVE NEGATIVE   Leukocytes,Ua MODERATE (A) NEGATIVE   RBC / HPF 0-5 0 - 5 RBC/hpf   WBC, UA 6-10 0 - 5 WBC/hpf   Bacteria, UA RARE (A) NONE SEEN   Squamous Epithelial / LPF 6-10 0 - 5   Mucus PRESENT    --/--/O POS (12/27 0006)  Imaging:  No results found.  MAU Course/MDM: I have ordered labs and reviewed results. Leukocytes in urine.  Sent for culture NST reviewed and is reactive, no contractions Consult Dr Nehemiah Settle with presentation, exam findings and test results.   I attempted to revisualize cervix to see if I could cauterize with silver nitrate, but area was difficult to expose adequately so I deferred  I recommened she call office and see if Dr Nelda Marseille could do an exam of cervix and recommend and perform treatment.    Assessment: Single intrauterine pregnancy at [redacted]w[redacted]d Bleeding in third trimester ? Friable cervix vs polyp at 5:00 position on cervix  Plan: Discharge home Labor precautions and fetal kick counts Follow up in Office for prenatal visits and recheck .Encouraged to return here or to other Urgent Care/ED if she develops worsening of symptoms, increase in pain, fever, or other concerning symptoms.  Pt stable at time of  discharge.  Hansel Feinstein CNM, MSN Certified Nurse-Midwife 06/15/2019 5:12 AM

## 2019-06-16 LAB — CULTURE, OB URINE: Culture: NO GROWTH

## 2019-08-17 ENCOUNTER — Encounter (HOSPITAL_COMMUNITY): Payer: Self-pay

## 2019-08-17 ENCOUNTER — Inpatient Hospital Stay (HOSPITAL_COMMUNITY)
Admission: AD | Admit: 2019-08-17 | Discharge: 2019-08-17 | Disposition: A | Discharge status: Home / Self Care | Payer: Medicaid Other | Attending: Obstetrics & Gynecology | Admitting: Obstetrics & Gynecology

## 2019-08-17 ENCOUNTER — Other Ambulatory Visit: Payer: Self-pay

## 2019-08-17 DIAGNOSIS — O471 False labor at or after 37 completed weeks of gestation: Secondary | ICD-10-CM | POA: Diagnosis not present

## 2019-08-17 DIAGNOSIS — Z3A38 38 weeks gestation of pregnancy: Secondary | ICD-10-CM | POA: Diagnosis not present

## 2019-08-17 DIAGNOSIS — O479 False labor, unspecified: Secondary | ICD-10-CM

## 2019-08-17 NOTE — Discharge Instructions (Signed)
Fetal Movement Counts Patient Name: ________________________________________________ Patient Due Date: ____________________ What is a fetal movement count?  A fetal movement count is the number of times that you feel your baby move during a certain amount of time. This may also be called a fetal kick count. A fetal movement count is recommended for every pregnant woman. You may be asked to start counting fetal movements as early as week 28 of your pregnancy. Pay attention to when your baby is most active. You may notice your baby's sleep and wake cycles. You may also notice things that make your baby move more. You should do a fetal movement count:  When your baby is normally most active.  At the same time each day. A good time to count movements is while you are resting, after having something to eat and drink. How do I count fetal movements? 1. Find a quiet, comfortable area. Sit, or lie down on your side. 2. Write down the date, the start time and stop time, and the number of movements that you felt between those two times. Take this information with you to your health care visits. 3. For 2 hours, count kicks, flutters, swishes, rolls, and jabs. You should feel at least 10 movements during 2 hours. 4. You may stop counting after you have felt 10 movements. 5. If you do not feel 10 movements in 2 hours, have something to eat and drink. Then, keep resting and counting for 1 hour. If you feel at least 4 movements during that hour, you may stop counting. Contact a health care provider if:  You feel fewer than 4 movements in 2 hours.  Your baby is not moving like he or she usually does. Date: ____________ Start time: ____________ Stop time: ____________ Movements: ____________ Date: ____________ Start time: ____________ Stop time: ____________ Movements: ____________ Date: ____________ Start time: ____________ Stop time: ____________ Movements: ____________ Date: ____________ Start time:  ____________ Stop time: ____________ Movements: ____________ Date: ____________ Start time: ____________ Stop time: ____________ Movements: ____________ Date: ____________ Start time: ____________ Stop time: ____________ Movements: ____________ Date: ____________ Start time: ____________ Stop time: ____________ Movements: ____________ Date: ____________ Start time: ____________ Stop time: ____________ Movements: ____________ Date: ____________ Start time: ____________ Stop time: ____________ Movements: ____________ This information is not intended to replace advice given to you by your health care provider. Make sure you discuss any questions you have with your health care provider. Document Released: 01/14/2007 Document Revised: 01/04/2019 Document Reviewed: 01/24/2016 Elsevier Patient Education  2020 [employer] Signs and Symptoms of Labor Labor is your body's natural process of moving your baby, placenta, and umbilical cord out of your uterus. The process of labor usually starts when your baby is full-term, between 37 and 40 weeks of pregnancy. How will I know when I am close to going into labor? As your body prepares for labor and the birth of your baby, you may notice the following symptoms in the weeks and days before true labor starts:  Having a strong desire to get your home ready to receive your new baby. This is called nesting. Nesting may be a sign that labor is approaching, and it may occur several weeks before birth. Nesting may involve cleaning and organizing your home.  Passing a small amount of thick, bloody mucus out of your vagina (normal bloody show or losing your mucus plug). This may happen more than a week before labor begins, or it might occur right before labor begins as the opening of the cervix starts   to widen (dilate). For some women, the entire mucus plug passes at once. For others, smaller portions of the mucus plug may gradually pass over several days.  Your baby  moving (dropping) lower in your pelvis to get into position for birth (lightening). When this happens, you may feel more pressure on your bladder and pelvic bone and less pressure on your ribs. This may make it easier to breathe. It may also cause you to need to urinate more often and have problems with bowel movements.  Having "practice contractions" (Braxton Hicks contractions) that occur at irregular (unevenly spaced) intervals that are more than 10 minutes apart. This is also called false labor. False labor contractions are common after exercise or sexual activity, and they will stop if you change position, rest, or drink fluids. These contractions are usually mild and do not get stronger over time. They may feel like: ? A backache or back pain. ? Mild cramps, similar to menstrual cramps. ? Tightening or pressure in your abdomen. Other early symptoms that labor may be starting soon include:  Nausea or loss of appetite.  Diarrhea.  Having a sudden burst of energy, or feeling very tired.  Mood changes.  Having trouble sleeping. How will I know when labor has begun? Signs that true labor has begun may include:  Having contractions that come at regular (evenly spaced) intervals and increase in intensity. This may feel like more intense tightening or pressure in your abdomen that moves to your back. ? Contractions may also feel like rhythmic pain in your upper thighs or back that comes and goes at regular intervals. ? For first-time mothers, this change in intensity of contractions often occurs at a more gradual pace. ? Women who have given birth before may notice a more rapid progression of contraction changes.  Having a feeling of pressure in the vaginal area.  Your water breaking (rupture of membranes). This is when the sac of fluid that surrounds your baby breaks. When this happens, you will notice fluid leaking from your vagina. This may be clear or blood-tinged. Labor usually starts  within 24 hours of your water breaking, but it may take longer to begin. ? Some women notice this as a gush of fluid. ? Others notice that their underwear repeatedly becomes damp. Follow these instructions at home:   When labor starts, or if your water breaks, call your health care provider or nurse care line. Based on your situation, they will determine when you should go in for an exam.  When you are in early labor, you may be able to rest and manage symptoms at home. Some strategies to try at home include: ? Breathing and relaxation techniques. ? Taking a warm bath or shower. ? Listening to music. ? Using a heating pad on the lower back for pain. If you are directed to use heat:  Place a towel between your skin and the heat source.  Leave the heat on for 20-30 minutes.  Remove the heat if your skin turns bright red. This is especially important if you are unable to feel pain, heat, or cold. You may have a greater risk of getting burned. Get help right away if:  You have painful, regular contractions that are 5 minutes apart or less.  Labor starts before you are [redacted] weeks along in your pregnancy.  You have a fever.  You have a headache that does not go away.  You have bright red blood coming from your vagina.  You   do not feel your baby moving.  You have a sudden onset of: ? Severe headache with vision problems. ? Nausea, vomiting, or diarrhea. ? Chest pain or shortness of breath. These symptoms may be an emergency. If your health care provider recommends that you go to the hospital or birth center where you plan to deliver, do not drive yourself. Have someone else drive you, or call emergency services (911 in the [place] Summary  Labor is your body's natural process of moving your baby, placenta, and umbilical cord out of your uterus.  The process of labor usually starts when your baby is full-term, between 37 and 40 weeks of pregnancy.  When labor starts, or if your water  breaks, call your health care provider or nurse care line. Based on your situation, they will determine when you should go in for an exam. This information is not intended to replace advice given to you by your health care provider. Make sure you discuss any questions you have with your health care provider. Document Released: 05/22/2017 Document Revised: 09/14/2017 Document Reviewed: 05/22/2017 Elsevier Patient Education  2020 Elsevier Inc.  

## 2019-08-17 NOTE — MAU Provider Note (Signed)
S: Patient is here for RN labor evaluation. Strip, vital signs, & chart Reviewed   O:  Vitals:   08/17/19 2154  BP: 115/66  Pulse: 83  Resp: 17  Temp: 98.1 F (36.7 C)  TempSrc: Oral  Weight: 81.3 kg  Height: 5\' 6"  (1.676 m)   No results found for this or any previous visit (from the past 24 hour(s)).  Dilation: Fingertip Exam by:: Elray Mcgregor, RN   FHR: 130 bpm, Mod Var, no Decels, 15x15 Accels UC: Q5-8 minutes   A: 1. False labor   2. [redacted] weeks gestation of pregnancy      P:  RN to discharge home in stable condition with return precautions & fetal kick counts  Jorje Guild FNP 11:38 PM

## 2019-08-17 NOTE — MAU Note (Signed)
Reports feeling contractions in her back and stomach today-started this morning then stopped now it has restarted.  No LOF/VB.  + FM.

## 2019-08-20 ENCOUNTER — Encounter (HOSPITAL_COMMUNITY): Payer: Self-pay | Admitting: *Deleted

## 2019-08-20 ENCOUNTER — Other Ambulatory Visit: Payer: Self-pay

## 2019-08-20 ENCOUNTER — Encounter (HOSPITAL_COMMUNITY): Payer: Self-pay | Admitting: Family Medicine

## 2019-08-20 ENCOUNTER — Inpatient Hospital Stay (HOSPITAL_COMMUNITY)
Admission: AD | Admit: 2019-08-20 | Discharge: 2019-08-22 | DRG: 807 | Disposition: A | Discharge status: Home / Self Care | Payer: Medicaid Other | Attending: Obstetrics & Gynecology | Admitting: Obstetrics & Gynecology

## 2019-08-20 ENCOUNTER — Inpatient Hospital Stay (HOSPITAL_COMMUNITY)
Admission: AD | Admit: 2019-08-20 | Discharge: 2019-08-20 | Disposition: A | Discharge status: Home / Self Care | Payer: Medicaid Other | Source: Home / Self Care | Attending: Obstetrics & Gynecology | Admitting: Obstetrics & Gynecology

## 2019-08-20 DIAGNOSIS — Z3A38 38 weeks gestation of pregnancy: Secondary | ICD-10-CM

## 2019-08-20 DIAGNOSIS — Z3689 Encounter for other specified antenatal screening: Secondary | ICD-10-CM

## 2019-08-20 DIAGNOSIS — Z20828 Contact with and (suspected) exposure to other viral communicable diseases: Secondary | ICD-10-CM | POA: Diagnosis present

## 2019-08-20 DIAGNOSIS — O365990 Maternal care for other known or suspected poor fetal growth, unspecified trimester, not applicable or unspecified: Secondary | ICD-10-CM | POA: Diagnosis present

## 2019-08-20 DIAGNOSIS — O479 False labor, unspecified: Secondary | ICD-10-CM

## 2019-08-20 DIAGNOSIS — O365930 Maternal care for other known or suspected poor fetal growth, third trimester, not applicable or unspecified: Secondary | ICD-10-CM | POA: Diagnosis not present

## 2019-08-20 LAB — CBC
HCT: 36.8 % (ref 36.0–46.0)
Hemoglobin: 12.5 g/dL (ref 12.0–15.0)
MCH: 31.7 pg (ref 26.0–34.0)
MCHC: 34 g/dL (ref 30.0–36.0)
MCV: 93.4 fL (ref 80.0–100.0)
Platelets: 219 10*3/uL (ref 150–400)
RBC: 3.94 MIL/uL (ref 3.87–5.11)
RDW: 13.8 % (ref 11.5–15.5)
WBC: 8.4 10*3/uL (ref 4.0–10.5)
nRBC: 0 % (ref 0.0–0.2)

## 2019-08-20 LAB — TYPE AND SCREEN
ABO/RH(D): O POS
Antibody Screen: NEGATIVE

## 2019-08-20 LAB — SARS CORONAVIRUS 2 (TAT 6-24 HRS): SARS Coronavirus 2: NEGATIVE

## 2019-08-20 MED ORDER — LIDOCAINE HCL (PF) 1 % IJ SOLN: 30.0000 mL | INTRAMUSCULAR | Status: DC | PRN

## 2019-08-20 MED ORDER — MISOPROSTOL 25 MCG QUARTER TABLET
25.0000 ug | ORAL_TABLET | ORAL | Status: DC | PRN
  Administered 2019-08-20 (×2): 25 ug via VAGINAL
  Filled 2019-08-20 (×2): qty 1

## 2019-08-20 MED ORDER — OXYCODONE-ACETAMINOPHEN 5-325 MG PO TABS: 2.0000 | ORAL_TABLET | ORAL | Status: DC | PRN

## 2019-08-20 MED ORDER — TERBUTALINE SULFATE 1 MG/ML IJ SOLN: 0.2500 mg | Freq: Once | INTRAMUSCULAR | Status: DC | PRN

## 2019-08-20 MED ORDER — SOD CITRATE-CITRIC ACID 500-334 MG/5ML PO SOLN: 30.0000 mL | ORAL | Status: DC | PRN

## 2019-08-20 MED ORDER — LACTATED RINGERS IV SOLN
INTRAVENOUS | Status: DC
  Administered 2019-08-20 – 2019-08-21 (×3): via INTRAVENOUS

## 2019-08-20 MED ORDER — ACETAMINOPHEN 325 MG PO TABS: 650.0000 mg | ORAL_TABLET | ORAL | Status: DC | PRN

## 2019-08-20 MED ORDER — OXYCODONE-ACETAMINOPHEN 5-325 MG PO TABS: 1.0000 | ORAL_TABLET | ORAL | Status: DC | PRN

## 2019-08-20 MED ORDER — ONDANSETRON HCL 4 MG/2ML IJ SOLN: 4.0000 mg | Freq: Four times a day (QID) | INTRAMUSCULAR | Status: DC | PRN

## 2019-08-20 MED ORDER — OXYTOCIN 40 UNITS IN NORMAL SALINE INFUSION - SIMPLE MED
2.5000 [IU]/h | INTRAVENOUS | Status: DC
  Filled 2019-08-20: qty 1000

## 2019-08-20 MED ORDER — OXYTOCIN BOLUS FROM INFUSION
500.0000 mL | Freq: Once | INTRAVENOUS | Status: AC
  Administered 2019-08-21: 10:00:00 500 mL via INTRAVENOUS

## 2019-08-20 MED ORDER — BUTORPHANOL TARTRATE 1 MG/ML IJ SOLN: 1.0000 mg | INTRAMUSCULAR | Status: DC | PRN

## 2019-08-20 MED ORDER — LACTATED RINGERS IV SOLN: 500.0000 mL | INTRAVENOUS | Status: DC | PRN

## 2019-08-20 NOTE — H&P (Signed)
HPI: 23 y/o G2P1001 @ [redacted]w[redacted]d estimated gestational age (as dated by LMP c/w 20 week ultrasound) presents for IOL due to growth restriction.   no Leaking of Fluid,   no Vaginal Bleeding,   irregular Uterine Contractions,  + Fetal Movement.  Prenatal care has been provided by Dr. Nelda Marseille  ROS: no HA, no epigastric pain, no visual changes.    Pregnancy complicated by: 1) Growth restriction -[place] on 8/12- vertex/anterior, EFW 5#12oz (24%)- AC growth <10%   Prenatal Transfer Tool  Maternal Diabetes: No Genetic Screening: Normal Maternal Ultrasounds/Referrals: Normal Fetal Ultrasounds or other Referrals:  None Maternal Substance Abuse:  No Significant Maternal Medications:  None Significant Maternal Lab Results: Group B Strep negative   PNL:  GBS negative, Rub Immune, Hep B neg, RPR NR, HIV neg, GC/C neg, glucola:137 Hgb: 12.7 Blood type: O positive, antibody neg  Immunizations: Tdap: 6/19 Flu: declined  OBHx: 2011- FTNSVD, 8#, uncomplicated PMHx:  none Meds:  PNV Allergy:  No Known Allergies SurgHx: none SocHx:   no Tobacco, no  EtOH, no Illicit Drugs  O: BP 295/18   Pulse 99   Temp (P) 97.7 F (36.5 C) (Axillary)   Resp 18   Ht (P) 5\' 6"  (1.676 m)   Wt (P) 80.9 kg   LMP 11/21/2018   BMI (P) 28.78 kg/m   Gen. AAOx3, NAD CV.  RRR  No murmur.  Resp. CTAB, no wheeze or crackles. Abd. Gravid,  no tenderness,  no rigidity,  no guarding Extr.  no edema B/L , no calf tenderness, neg Homan's B/L  FHT: 130 baseline, moderate variability, + accels,  no decels Toco: irregular SVE: closed/20/-3, vertex, soft, cytotec placed   Labs: see orders  A/P:  23 y.o. G2P1001 @ [redacted]w[redacted]d EGA who presents for IOL due to fetal growth restriction -FWB:  NICHD Cat I FHTs -Labor/Induction: cytotec #1 placed -GBS: negative -Pain management: IV or epidural upon request  Janyth Pupa, DO [phone] (cell) [phone] (office)

## 2019-08-20 NOTE — Discharge Instructions (Signed)
Braxton Hicks Contractions Contractions of the uterus can occur throughout pregnancy, but they are not always a sign that you are in labor. You may have practice contractions called Braxton Hicks contractions. These false labor contractions are sometimes confused with true labor. What are Braxton Hicks contractions? Braxton Hicks contractions are tightening movements that occur in the muscles of the uterus before labor. Unlike true labor contractions, these contractions do not result in opening (dilation) and thinning of the cervix. Toward the end of pregnancy (32-34 weeks), Braxton Hicks contractions can happen more often and may become stronger. These contractions are sometimes difficult to tell apart from true labor because they can be very uncomfortable. You should not feel embarrassed if you go to the hospital with false labor. Sometimes, the only way to tell if you are in true labor is for your health care provider to look for changes in the cervix. The health care provider will do a physical exam and may monitor your contractions. If you are not in true labor, the exam should show that your cervix is not dilating and your water has not broken. If there are no other health problems associated with your pregnancy, it is completely safe for you to be sent home with false labor. You may continue to have Braxton Hicks contractions until you go into true labor. How to tell the difference between true labor and false labor True labor  Contractions last 30-70 seconds.  Contractions become very regular.  Discomfort is usually felt in the top of the uterus, and it spreads to the lower abdomen and low back.  Contractions do not go away with walking.  Contractions usually become more intense and increase in frequency.  The cervix dilates and gets thinner. False labor  Contractions are usually shorter and not as strong as true labor contractions.  Contractions are usually irregular.  Contractions  are often felt in the front of the lower abdomen and in the groin.  Contractions may go away when you walk around or change positions while lying down.  Contractions get weaker and are shorter-lasting as time goes on.  The cervix usually does not dilate or become thin. Follow these instructions at home:   Take over-the-counter and prescription medicines only as told by your health care provider.  Keep up with your usual exercises and follow other instructions from your health care provider.  Eat and drink lightly if you think you are going into labor.  If Braxton Hicks contractions are making you uncomfortable: ? Change your position from lying down or resting to walking, or change from walking to resting. ? Sit and rest in a tub of warm water. ? Drink enough fluid to keep your urine pale yellow. Dehydration may cause these contractions. ? Do slow and deep breathing several times an hour.  Keep all follow-up prenatal visits as told by your health care provider. This is important. Contact a health care provider if:  You have a fever.  You have continuous pain in your abdomen. Get help right away if:  Your contractions become stronger, more regular, and closer together.  You have fluid leaking or gushing from your vagina.  You pass blood-tinged mucus (bloody show).  You have bleeding from your vagina.  You have low back pain that you never had before.  You feel your baby's head pushing down and causing pelvic pressure.  Your baby is not moving inside you as much as it used to. Summary  Contractions that occur before labor are   called Braxton Hicks contractions, false labor, or practice contractions.  Braxton Hicks contractions are usually shorter, weaker, farther apart, and less regular than true labor contractions. True labor contractions usually become progressively stronger and regular, and they become more frequent.  Manage discomfort from Braxton Hicks contractions  by changing position, resting in a warm bath, drinking plenty of water, or practicing deep breathing. This information is not intended to replace advice given to you by your health care provider. Make sure you discuss any questions you have with your health care provider. Document Released: 04/30/2017 Document Revised: 11/27/2017 Document Reviewed: 04/30/2017 Elsevier Patient Education  2020 Else[employer] Inc[employer]  

## 2019-08-20 NOTE — Progress Notes (Signed)
Colman Cater CNM aware of pt's arrival and status. Aware of ctx pattern, sve, reactive FHR. Pt stable for d/c home.

## 2019-08-20 NOTE — Progress Notes (Signed)
Offered to reck pt's cervix but pt stated she was ok with going home without repeat exam.

## 2019-08-20 NOTE — MAU Note (Signed)
Pt denies any covid symptoms, swab collected 

## 2019-08-20 NOTE — MAU Note (Signed)
Ctxs since 67mn. Was closed last sve. When asked about LOF states " I keep peeing on myself" but unsure if water is broken

## 2019-08-20 NOTE — Progress Notes (Signed)
Written and verbal d/c instructions given and understanding voiced. 

## 2019-08-21 ENCOUNTER — Inpatient Hospital Stay (HOSPITAL_COMMUNITY): Payer: Medicaid Other | Admitting: Anesthesiology

## 2019-08-21 ENCOUNTER — Encounter (HOSPITAL_COMMUNITY): Payer: Self-pay | Admitting: Family Medicine

## 2019-08-21 LAB — RPR: RPR Ser Ql: NONREACTIVE

## 2019-08-21 MED ORDER — DIPHENHYDRAMINE HCL 50 MG/ML IJ SOLN: 12.5000 mg | INTRAMUSCULAR | Status: DC | PRN

## 2019-08-21 MED ORDER — EPHEDRINE 5 MG/ML INJ: 10.0000 mg | INTRAVENOUS | Status: DC | PRN

## 2019-08-21 MED ORDER — OXYTOCIN 40 UNITS IN NORMAL SALINE INFUSION - SIMPLE MED
1.0000 m[IU]/min | INTRAVENOUS | Status: DC
  Administered 2019-08-21: 03:00:00 2 m[IU]/min via INTRAVENOUS

## 2019-08-21 MED ORDER — COCONUT OIL OIL: 1.0000 "application " | TOPICAL_OIL | Status: DC | PRN

## 2019-08-21 MED ORDER — SODIUM CHLORIDE (PF) 0.9 % IJ SOLN
INTRAMUSCULAR | Status: DC | PRN
  Administered 2019-08-21: 12 mL/h via EPIDURAL

## 2019-08-21 MED ORDER — SENNOSIDES-DOCUSATE SODIUM 8.6-50 MG PO TABS
2.0000 | ORAL_TABLET | ORAL | Status: DC
  Administered 2019-08-22: 2 via ORAL
  Filled 2019-08-21: qty 2

## 2019-08-21 MED ORDER — LACTATED RINGERS IV SOLN
500.0000 mL | Freq: Once | INTRAVENOUS | Status: AC
  Administered 2019-08-21: 04:00:00 500 mL via INTRAVENOUS

## 2019-08-21 MED ORDER — PHENYLEPHRINE 40 MCG/ML (10ML) SYRINGE FOR IV PUSH (FOR BLOOD PRESSURE SUPPORT): 80.0000 ug | PREFILLED_SYRINGE | INTRAVENOUS | Status: DC | PRN

## 2019-08-21 MED ORDER — LIDOCAINE HCL (PF) 1 % IJ SOLN
INTRAMUSCULAR | Status: DC | PRN
  Administered 2019-08-21: 8 mL via EPIDURAL

## 2019-08-21 MED ORDER — ZOLPIDEM TARTRATE 5 MG PO TABS: 5.0000 mg | ORAL_TABLET | Freq: Every evening | ORAL | Status: DC | PRN

## 2019-08-21 MED ORDER — WITCH HAZEL-GLYCERIN EX PADS: 1.0000 "application " | MEDICATED_PAD | CUTANEOUS | Status: DC | PRN

## 2019-08-21 MED ORDER — ONDANSETRON HCL 4 MG PO TABS: 4.0000 mg | ORAL_TABLET | ORAL | Status: DC | PRN

## 2019-08-21 MED ORDER — PRENATAL MULTIVITAMIN CH
1.0000 | ORAL_TABLET | Freq: Every day | ORAL | Status: DC
  Administered 2019-08-21 – 2019-08-22 (×2): 1 via ORAL
  Filled 2019-08-21 (×2): qty 1

## 2019-08-21 MED ORDER — TERBUTALINE SULFATE 1 MG/ML IJ SOLN: 0.2500 mg | Freq: Once | INTRAMUSCULAR | Status: DC | PRN

## 2019-08-21 MED ORDER — OXYTOCIN 40 UNITS IN NORMAL SALINE INFUSION - SIMPLE MED: 1.0000 m[IU]/min | INTRAVENOUS | Status: DC

## 2019-08-21 MED ORDER — SIMETHICONE 80 MG PO CHEW: 80.0000 mg | CHEWABLE_TABLET | ORAL | Status: DC | PRN

## 2019-08-21 MED ORDER — BENZOCAINE-MENTHOL 20-0.5 % EX AERO
1.0000 "application " | INHALATION_SPRAY | CUTANEOUS | Status: DC | PRN
  Administered 2019-08-21: 1 via TOPICAL
  Filled 2019-08-21: qty 56

## 2019-08-21 MED ORDER — PHENYLEPHRINE 40 MCG/ML (10ML) SYRINGE FOR IV PUSH (FOR BLOOD PRESSURE SUPPORT)
80.0000 ug | PREFILLED_SYRINGE | INTRAVENOUS | Status: DC | PRN
  Filled 2019-08-21: qty 10

## 2019-08-21 MED ORDER — IBUPROFEN 600 MG PO TABS
600.0000 mg | ORAL_TABLET | Freq: Four times a day (QID) | ORAL | Status: DC
  Administered 2019-08-21 – 2019-08-22 (×5): 600 mg via ORAL
  Filled 2019-08-21 (×5): qty 1

## 2019-08-21 MED ORDER — ONDANSETRON HCL 4 MG/2ML IJ SOLN: 4.0000 mg | INTRAMUSCULAR | Status: DC | PRN

## 2019-08-21 MED ORDER — DIBUCAINE (PERIANAL) 1 % EX OINT: 1.0000 "application " | TOPICAL_OINTMENT | CUTANEOUS | Status: DC | PRN

## 2019-08-21 MED ORDER — FENTANYL-BUPIVACAINE-NACL 0.5-0.125-0.9 MG/250ML-% EP SOLN
12.0000 mL/h | EPIDURAL | Status: DC | PRN
  Filled 2019-08-21: qty 250

## 2019-08-21 MED ORDER — DIPHENHYDRAMINE HCL 25 MG PO CAPS: 25.0000 mg | ORAL_CAPSULE | Freq: Four times a day (QID) | ORAL | Status: DC | PRN

## 2019-08-21 MED ORDER — ACETAMINOPHEN 325 MG PO TABS
650.0000 mg | ORAL_TABLET | ORAL | Status: DC | PRN
  Administered 2019-08-21: 21:00:00 650 mg via ORAL
  Filled 2019-08-21: qty 2

## 2019-08-21 NOTE — Anesthesia Postprocedure Evaluation (Signed)
Anesthesia Post Note  Patient: Sarah Quinn  Procedure(s) Performed: AN AD [place]     Patient location during evaluation: Mother Baby Anesthesia Type: Epidural Level of consciousness: awake Pain management: satisfactory to patient Vital Signs Assessment: post-procedure vital signs reviewed and stable Respiratory status: spontaneous breathing Cardiovascular status: stable Anesthetic complications: no    Last Vitals:  Vitals:   08/21/19 1200 08/21/19 1307  BP: (!) 109/58 111/64  Pulse: 65 74  Resp: 18 16  Temp: 36.8 C 36.7 C  SpO2:      Last Pain:  Vitals:   08/21/19 1310  TempSrc:   PainSc: 0-No pain   Pain Goal:                   [employer]

## 2019-08-21 NOTE — Progress Notes (Signed)
OB PN:  S: Pt now resting comfortably with epidural  O: BP (!) 95/52   Pulse 89   Temp 98.6 F (37 C) (Axillary)   Resp 16   Ht 5\' 6"  (1.676 m)   Wt 80.9 kg   LMP 11/21/2018   SpO2 100%   BMI 28.78 kg/m   FHT: 130bpm, moderate variablity, + accels, occasional variable decel Toco: q3-110min SVE: 3/70/-2, AROM clear fluid  A/P: 23 y.o. G2P1001 @ [redacted]w[redacted]d for IOL due to fetal growth restriction 1. FWB: Cat. II- due to occasional variable, overall FHT reassuring, mostly Cat. I 2. Labor: continue Pitocin per protocol, pt repositioned Pain: continue epidural GBS: negative  Janyth Pupa, DO [phone] (cell) [phone] (office)

## 2019-08-21 NOTE — Anesthesia Preprocedure Evaluation (Signed)
Anesthesia Evaluation  Patient identified by MRN, date of birth, ID band Patient awake    Reviewed: Allergy & Precautions, H&P , NPO status , Patient's Chart, lab work & pertinent test results  Airway Mallampati: I  TM Distance: >3 FB Neck ROM: full    Dental no notable dental hx. (+) Teeth Intact   Pulmonary neg pulmonary ROS,    Pulmonary exam normal breath sounds clear to auscultation       Cardiovascular negative cardio ROS Normal cardiovascular exam Rhythm:regular Rate:Normal     Neuro/Psych negative neurological ROS  negative psych ROS   GI/Hepatic negative GI ROS, Neg liver ROS,   Endo/Other  negative endocrine ROS  Renal/GU negative Renal ROS     Musculoskeletal   Abdominal Normal abdominal exam  (+)   Peds  Hematology negative hematology ROS (+)   Anesthesia Other Findings   Reproductive/Obstetrics (+) Pregnancy                            Anesthesia Physical Anesthesia Plan  ASA: II  Anesthesia Plan: Epidural   Post-op Pain Management:    Induction:   PONV Risk Score and Plan:   Airway Management Planned:   Additional Equipment:   Intra-op Plan:   Post-operative Plan:   Informed Consent: I have reviewed the patients History and Physical, chart, labs and discussed the procedure including the risks, benefits and alternatives for the proposed anesthesia with the patient or authorized representative who has indicated his/her understanding and acceptance.     Plan Discussed with:   Anesthesia Plan Comments:         Anesthesia Quick Evaluation  

## 2019-08-21 NOTE — Anesthesia Procedure Notes (Signed)
Epidural Patient location during procedure: OB Start time: 08/21/2019 4:45 AM End time: 08/21/2019 4:50 AM  Staffing Anesthesiologist: Lyn Hollingshead, MD Performed: anesthesiologist   Preanesthetic Checklist Completed: patient identified, site marked, surgical consent, pre-op evaluation, timeout performed, IV checked, risks and benefits discussed and monitors and equipment checked  Epidural Patient position: sitting Prep: site prepped and draped and DuraPrep Patient monitoring: continuous pulse ox and blood pressure Approach: midline Injection technique: LOR air  Needle:  Needle type: Tuohy  Needle gauge: 17 G Needle length: 9 cm and 9 Needle insertion depth: 6 cm Catheter type: closed end flexible Catheter size: 19 Gauge Catheter at skin depth: 11 cm Test dose: negative and Other  Assessment Events: blood not aspirated, injection not painful, no injection resistance, negative IV test and no paresthesia  Additional Notes Reason for block:procedure for pain

## 2019-08-22 LAB — CBC
HCT: 36.4 % (ref 36.0–46.0)
Hemoglobin: 11.9 g/dL — ABNORMAL LOW (ref 12.0–15.0)
MCH: 31.5 pg (ref 26.0–34.0)
MCHC: 32.7 g/dL (ref 30.0–36.0)
MCV: 96.3 fL (ref 80.0–100.0)
Platelets: 201 10*3/uL (ref 150–400)
RBC: 3.78 MIL/uL — ABNORMAL LOW (ref 3.87–5.11)
RDW: 13.9 % (ref 11.5–15.5)
WBC: 13.4 10*3/uL — ABNORMAL HIGH (ref 4.0–10.5)
nRBC: 0 % (ref 0.0–0.2)

## 2019-08-22 MED ORDER — IBUPROFEN 600 MG PO TABS: 600.0000 mg | ORAL_TABLET | Freq: Four times a day (QID) | ORAL | 0 refills | Status: DC | PRN

## 2019-08-22 NOTE — Discharge Instructions (Signed)

## 2019-08-22 NOTE — Lactation Note (Signed)
This note was copied from a baby's chart. Lactation Consultation Note Mom has decided to just formula feed. Engorgement management discussed.  Patient Name: Sarah Quinn [id number] Date: 08/22/2019     Maternal Data    Feeding Feeding Type: Bottle Fed - Formula Nipple Type: Slow - flow  LATCH Score                   Interventions    Lactation Tools Discussed/Used     Consult Status      Clifton Safley G 08/22/2019, 1:03 AM

## 2019-08-22 NOTE — Discharge Summary (Signed)
OB Discharge Summary     Patient Name: Sarah Quinn DOB: 1996/10/18 MRN: [id number]  Date of admission: 08/20/2019 Delivering MD: Myna HidalgoZAN, Alexsandro Salek   Date of discharge: 08/22/2019  Admitting diagnosis: inpatient Intrauterine pregnancy: 3662w0d     Secondary diagnosis:  Active Problems:   Intrauterine growth restriction (IUGR) affecting care of mother  Additional problems: none     Discharge diagnosis: Term Pregnancy Delivered                                                                                                Post partum procedures:none  Augmentation: AROM, Pitocin and Cytotec  Complications: None  Hospital course:  Induction of Labor With Vaginal Delivery   23 y.o. yo [id number] at 3162w0d was admitted to the hospital 08/20/2019 for induction of labor.  Indication for induction: IUGR.  Patient had an uncomplicated labor course as follows: Membrane Rupture Time/Date: 7:20 AM ,08/21/2019   Intrapartum Procedures: Episiotomy: None [1]                                         Lacerations:  None [1];Periurethral [8]  Patient had delivery of a Viable infant.  Information for the patient's newborn:  Wylie HailLipscomb, Girl Gita [[id number]][[id number]]      08/21/2019  Details of delivery can be found in separate delivery note.  Patient had a routine postpartum course. Patient is discharged home 08/22/19.  Physical exam  Vitals:   08/21/19 1719 08/21/19 2044 08/22/19 0106 08/22/19 0545  BP: 103/67 102/67 107/64 119/78  Pulse: 72 74 81 68  Resp: 16 18 18 18   Temp: 98.2 F (36.8 C) 98.3 F (36.8 C) 98 F (36.7 C) 97.7 F (36.5 C)  TempSrc: Oral Oral Oral Oral  SpO2: 99% 100%    Weight:      Height:       General: alert, cooperative and no distress Lochia: appropriate Uterine Fundus: firm Incision: N/A DVT Evaluation: No evidence of DVT seen on physical exam. Labs: Lab Results  Component Value Date   WBC 13.4 (H) 08/22/2019   HGB 11.9 (L) 08/22/2019   HCT 36.4 08/22/2019   MCV  96.3 08/22/2019   PLT 201 08/22/2019   CMP Latest Ref Rng & Units 12/23/2018  Glucose 70 - 99 mg/dL 84  BUN 6 - 20 mg/dL 7  Creatinine 1.300.44 - 8.651.00 mg/dL 7.84(O1.03(H)  Sodium 962135 - 952145 mmol/L 137  Potassium 3.5 - 5.1 mmol/L 3.3(L)  Chloride 98 - 111 mmol/L 104  CO2 22 - 32 mmol/L 28  Calcium 8.9 - 10.3 mg/dL 9.1  Total Protein 6.5 - 8.1 g/dL 7.1  Total Bilirubin 0.3 - 1.2 mg/dL 0.8  Alkaline Phos 38 - 126 U/L 39  AST 15 - 41 U/L 19  ALT 0 - 44 U/L 17    Discharge instruction: per After Visit Summary and "Baby and Me Booklet".  After visit meds:  Allergies as of 08/22/2019   No Known Allergies     Medication List  TAKE these medications   ibuprofen 600 MG tablet Commonly known as: ADVIL Take 1 tablet (600 mg total) by mouth every 6 (six) hours as needed for moderate pain.   Prenatal Complete 14-0.4 MG Tabs Take 2 tablets by mouth daily.       Diet: routine diet  Activity: Advance as tolerated. Pelvic rest for 6 weeks.   Outpatient follow up:6 weeks Follow up Appt:No future appointments. Follow up Visit:No follow-ups on file.  Postpartum contraception: Undecided  Newborn Data: Live born female  Birth Weight: 5 lb 10.7 oz (2571 g) APGAR: 8, 9  Newborn Delivery   Birth date/time: 08/21/2019 09:42:00 Delivery type: Vaginal, Spontaneous      Baby Feeding: Bottle Disposition:home with mother   08/22/2019 Annalee Genta, DO

## 2019-08-22 NOTE — Progress Notes (Signed)
Postpartum Note Day # 1  S:  Patient resting comfortable in bed.  Pain controlled.  Tolerating general diet.  + flatus, no BM.  Lochia moderate.  Ambulating without difficulty.  She denies n/v/f/c, SOB, or CP.  Pt plans on bottle feeding  O: Temp:  [97.7 F (36.5 C)-99.6 F (37.6 C)] 97.7 F (36.5 C) (08/24 0545) Pulse Rate:  [65-115] 68 (08/24 0545) Resp:  [16-18] 18 (08/24 0545) BP: (97-121)/(57-93) 119/78 (08/24 0545) SpO2:  [99 %-100 %] 100 % (08/23 2044)   Gen: A&Ox3, NAD Resp: normal respiratory rate and effort Abdomen: soft, NT, ND Uterus: firm, non-tender, below umbilicus Ext: No edema, no calf tenderness bilaterally, SCDs in place  Labs:  Recent Labs    08/20/19 1747 08/22/19 0458  HGB 12.5 11.9*    A/P: Pt is a 23 y.o. L2G4010 s/p NSVD, PPD#1  - Pain well controlled -GU: voiding freely -GI: Tolerating general diet -Activity: encouraged sitting up to chair and ambulation as tolerated -Prophylaxis: early ambulation -Labs: stable as above  DISPO: Meeting postpartum milestones appropriately, plan for possible early discharge home today pending baby's status  Janyth Pupa, DO [phone] (cell) [phone] (office)

## 2019-11-10 ENCOUNTER — Emergency Department (HOSPITAL_COMMUNITY)
Admission: EM | Admit: 2019-11-10 | Discharge: 2019-11-10 | Disposition: A | Discharge status: Home / Self Care | Payer: Medicaid Other | Attending: Emergency Medicine | Admitting: Emergency Medicine

## 2019-11-10 DIAGNOSIS — B9689 Other specified bacterial agents as the cause of diseases classified elsewhere: Secondary | ICD-10-CM | POA: Diagnosis not present

## 2019-11-10 DIAGNOSIS — H538 Other visual disturbances: Secondary | ICD-10-CM | POA: Diagnosis present

## 2019-11-10 DIAGNOSIS — H109 Unspecified conjunctivitis: Secondary | ICD-10-CM

## 2019-11-10 DIAGNOSIS — H1033 Unspecified acute conjunctivitis, bilateral: Secondary | ICD-10-CM | POA: Insufficient documentation

## 2019-11-10 MED ORDER — ERYTHROMYCIN 5 MG/GM OP OINT: 1.0000 "application " | TOPICAL_OINTMENT | Freq: Four times a day (QID) | OPHTHALMIC | 0 refills | Status: AC

## 2019-11-10 NOTE — ED Provider Notes (Signed)
[facility] EMERGENCY DEPARTMENT Provider Note   CSN: [id number] Arrival date & time: 11/10/19  1358     History   Chief Complaint Chief Complaint  Patient presents with  . Eye Pain    HPI Sarah Quinn is a 23 y.o. female with no significant past medical history who presents to the ED due to sudden onset of red eyes for the past 2 days. Patient notes her symptoms started in her left eye and later spread to her right eye. Red eyes are associated with blurry vision and clear discharge, but more purulent at night and in the morning. Patient does not wear contacts. She has tried hot compresses and eye drops with no relief. Patient notes when she wakes up in the morning both of her eyes are glued shut. Patient denies fever, pain with EOMs, injury to eye, cough, rhinorrhea, and sore throat.   Past Medical History:  Diagnosis Date  . Hx of chlamydia infection   . Hx of gonorrhea   . Syncope     Patient Active Problem List   Diagnosis Date Noted  . Intrauterine growth restriction (IUGR) affecting care of mother 08/20/2019  . SCOLIOSIS 08/31/2007    Past Surgical History:  Procedure Laterality Date  . NO PAST SURGERIES       OB History    Gravida  2   Para  2   Term  2   Preterm  0   AB  0   Living  2     SAB  0   TAB  0   Ectopic  0   Multiple  0   Live Births  2            Home Medications    Prior to Admission medications   Medication Sig Start Date End Date Taking? Authorizing Provider  erythromycin ophthalmic ointment Place 1 application into both eyes 4 (four) times daily for 5 days. 11/10/19 11/15/19  Cheek, Vesta Mixer, PA-C  ibuprofen (ADVIL) 600 MG tablet Take 1 tablet (600 mg total) by mouth every 6 (six) hours as needed for moderate pain. 08/22/19   Myna Hidalgo, DO  Prenatal Vit-Fe Fumarate-FA (PRENATAL COMPLETE) 14-0.4 MG TABS Take 2 tablets by mouth daily. 12/24/18   Muthersbaugh, Dahlia Client, PA-C    Family History  Family History  Problem Relation Age of Onset  . Healthy Mother   . Healthy Father     Social History Social History   Tobacco Use  . Smoking status: Never Smoker  . Smokeless tobacco: Never Used  Substance Use Topics  . Alcohol use: No    Frequency: Never  . Drug use: Not Currently    Types: Marijuana    Comment: last smoked January 2020     Allergies   Patient has no known allergies.   Review of Systems Review of Systems  Constitutional: Negative for chills and fever.  HENT: Negative for facial swelling, rhinorrhea and sore throat.   Eyes: Positive for discharge, redness and visual disturbance.  Respiratory: Negative for shortness of breath.   Cardiovascular: Negative for chest pain.     Physical Exam Updated Vital Signs There were no vitals taken for this visit.  Physical Exam Vitals signs and nursing note reviewed.  Constitutional:      General: She is not in acute distress.    Appearance: Normal appearance. She is not ill-appearing.  HENT:     Head: Normocephalic.     Nose: Nose normal.  Mouth/Throat:     Pharynx: No oropharyngeal exudate or posterior oropharyngeal erythema.  Eyes:     General:        Right eye: Discharge present.        Left eye: Discharge present.    Extraocular Movements: Extraocular movements intact.     Pupils: Pupils are equal, round, and reactive to light.     Comments: Injected conjunctiva bilaterally. No ciliary flush. No hyphema. Normal EOMs with no pain. Gross visual acuity normal. No periorbital edema or tenderness to palpation around orbits.   Neck:     Musculoskeletal: Neck supple.  Cardiovascular:     Rate and Rhythm: Normal rate and regular rhythm.     Pulses: Normal pulses.     Heart sounds: Normal heart sounds. No murmur. No friction rub. No gallop.   Pulmonary:     Effort: Pulmonary effort is normal.     Breath sounds: Normal breath sounds.     Comments: Clear to auscultation bilaterally Abdominal:      General: Abdomen is flat. There is no distension.     Palpations: Abdomen is soft.     Tenderness: There is no abdominal tenderness. There is no guarding or rebound.  Neurological:     General: No focal deficit present.     Mental Status: She is alert.      ED Treatments / Results  Labs (all labs ordered are listed, but only abnormal results are displayed) Labs Reviewed - No data to display  EKG None  Radiology No results found.  Procedures Procedures (including critical care time)  Medications Ordered in ED Medications - No data to display   Initial Impression / Assessment and Plan / ED Course  I have reviewed the triage vital signs and the nursing notes.  Pertinent labs & imaging results that were available during my care of the patient were reviewed by me and considered in my medical decision making (see chart for details).        23 year old female presents with symptoms consistent with bacterial conjunctivitis. Clear discharge on exam; however patient notes purulent discharge at night and in the morning.  No corneal abrasions, entrapment, or consensual photophobia. Presentation non-concerning for iritis, corneal abrasions, or HSV. No evidence of preseptal or orbital cellulitis.  Pt is not a contact lens wearer. Patient will be given erythromycin ophthalmic ointment.  Personal hygiene and frequent handwashing discussed.  Patient advised to followup with PCP for reevaluation in several days. PCP number provided to patient. Strict ED precautions discussed with patient. Patient states understanding and agrees to plan. Patient discharged home in no acute distress.  Final Clinical Impressions(s) / ED Diagnoses   Final diagnoses:  Bacterial conjunctivitis    ED Discharge Orders         Ordered    erythromycin ophthalmic ointment  4 times daily     11/10/19 [address] 11/10/19 1631    Davonna Belling, MD 11/11/19 1702

## 2019-11-10 NOTE — ED Triage Notes (Signed)
Pt States that she has been having both of her eyes drain & red for the past few days. She uses OTC eye drops with no release & wakes up each morning with her eyes "crusty" and the lids "stuck " together. Remains afebrile.

## 2019-11-10 NOTE — Discharge Instructions (Addendum)
Today you have been diagnosed with pink eye. I am sending you home with ointment. Use as prescribed. I have included directions on how to use the ointment in your discharge summary. I am also giving you the number of a primary care doctor. Call and schedule and appointment if symptoms do not improve within the next few days. Return to the ER for new or worsening symptoms.

## 2020-02-22 IMAGING — US US OB TRANSVAGINAL
1 series · 15 of 28 positions shown · non-contrast
Comparison: Ultrasound December 24, 2018.

CLINICAL DATA: Pregnancy of unknown location.

EXAM:
TRANSVAGINAL OB ULTRASOUND
TECHNIQUE: Transvaginal ultrasound was performed for complete evaluation of the
gestation as well as the maternal uterus, adnexal regions, and
pelvic cul-de-sac.

[Series 1: us ob transvaginal · 63 acquisitions, 15 frames shown]
[im 1/63]
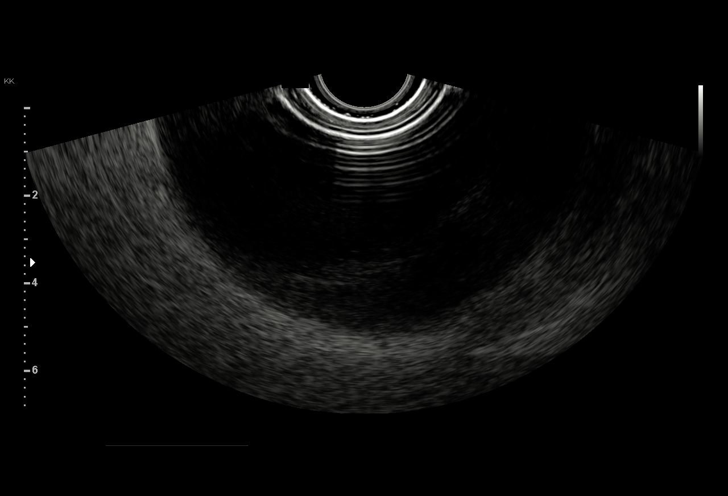
[im 5/63]
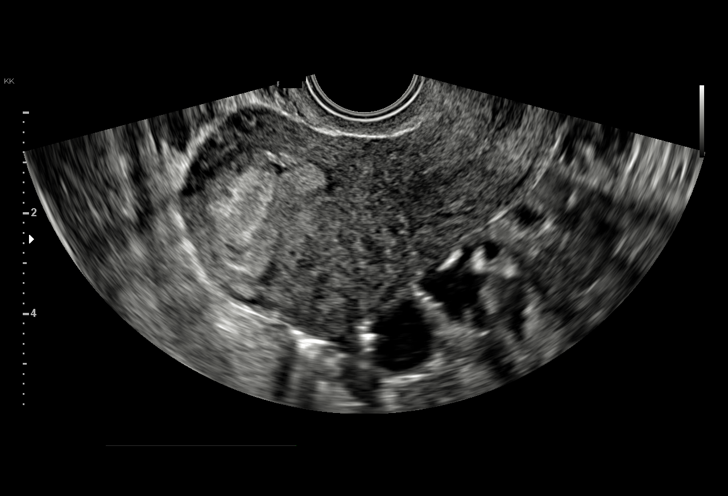
[im 10/63]
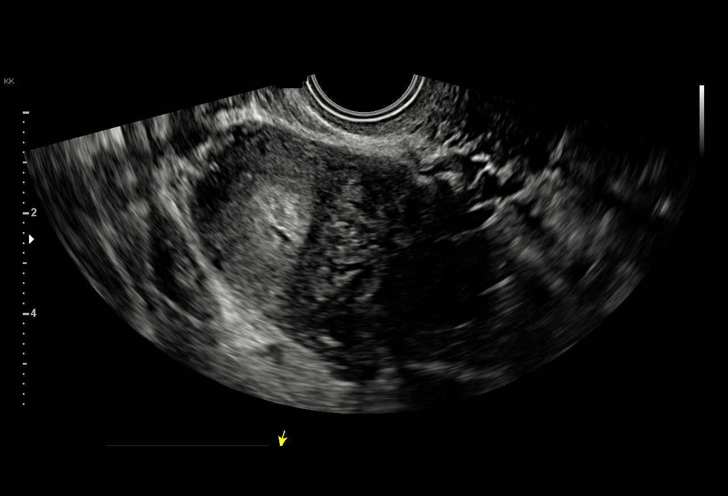
[im 14/63]
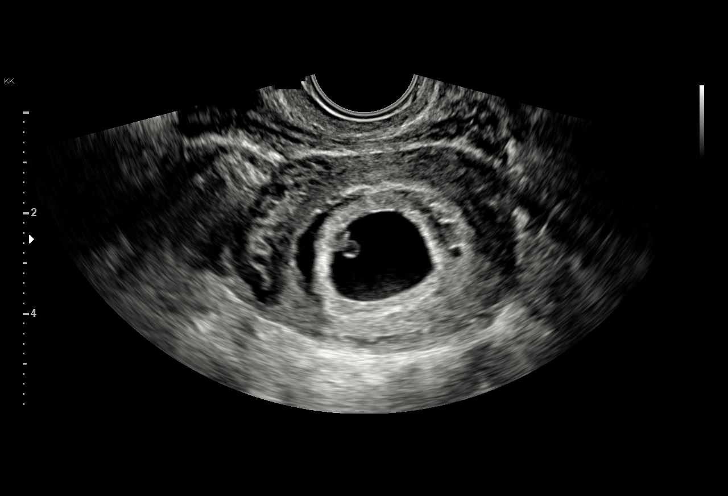
[im 19/63]
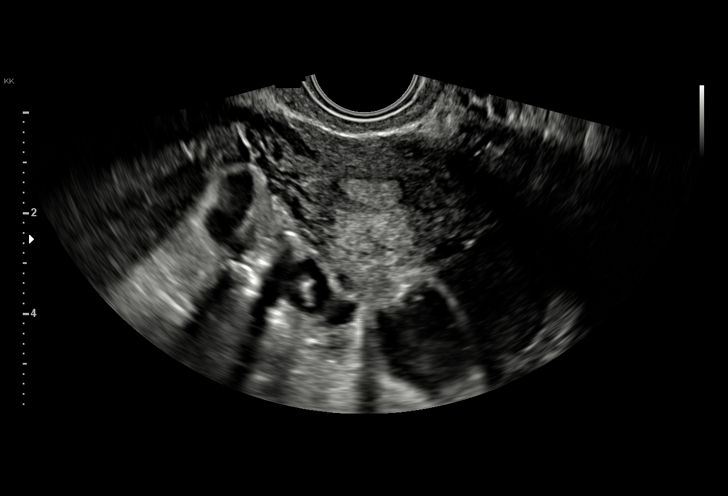
[im 23/63]
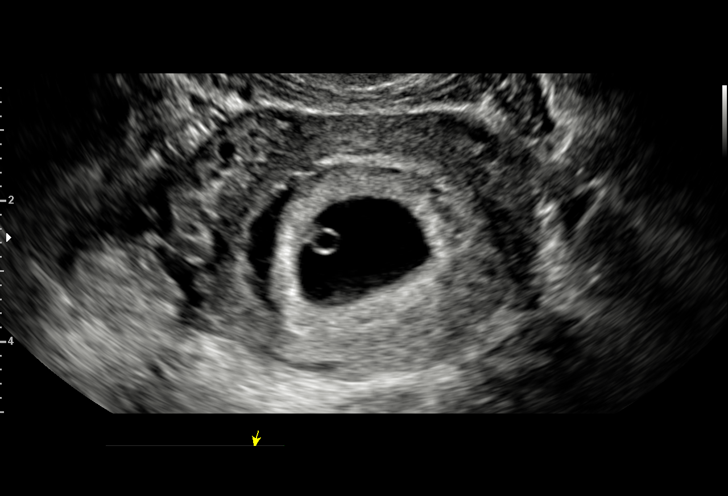
[im 28/63]
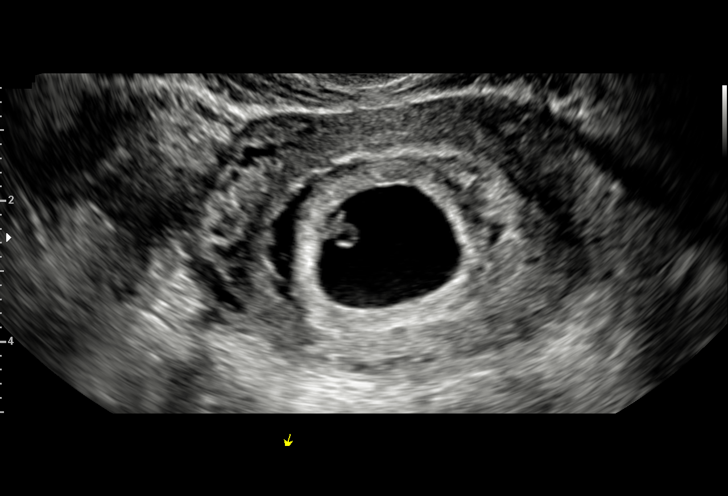
[im 33/63]
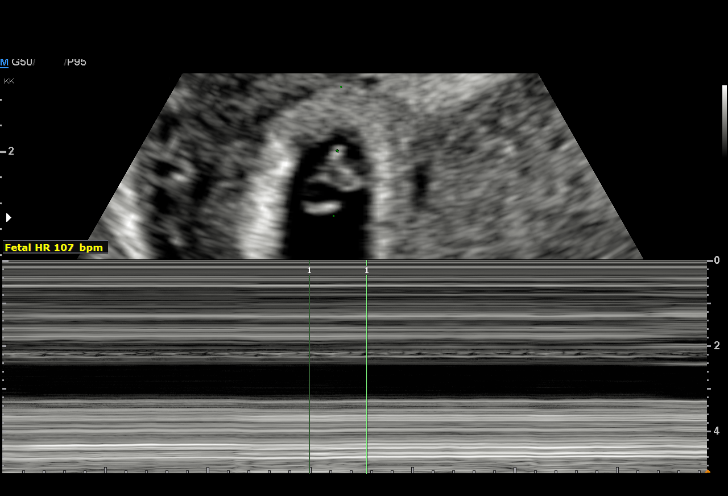
[im 35/63]
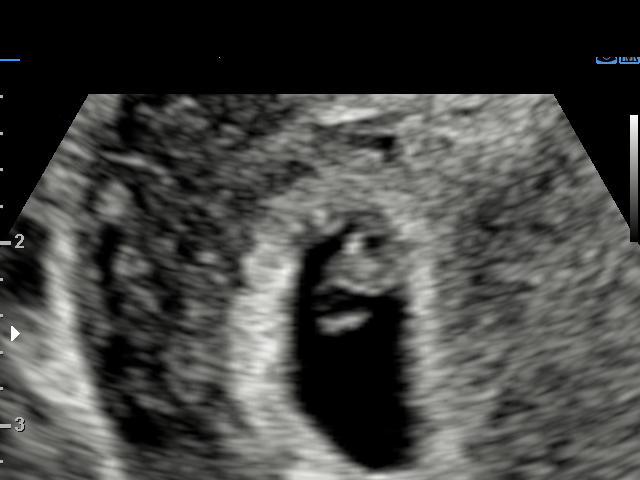
[im 40/63]
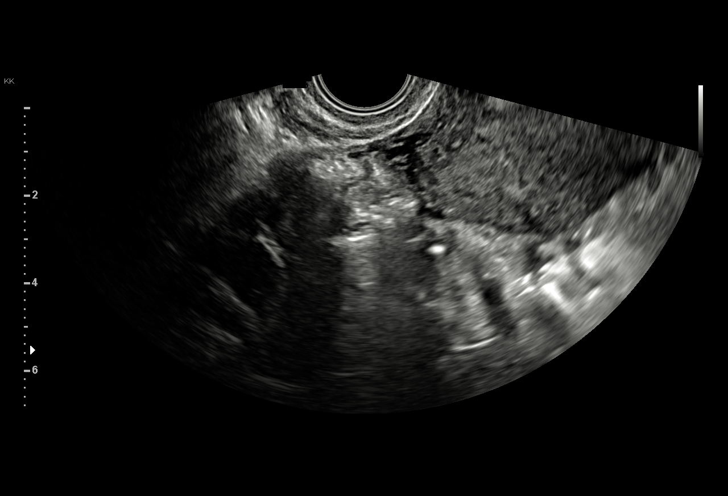
[im 44/63]
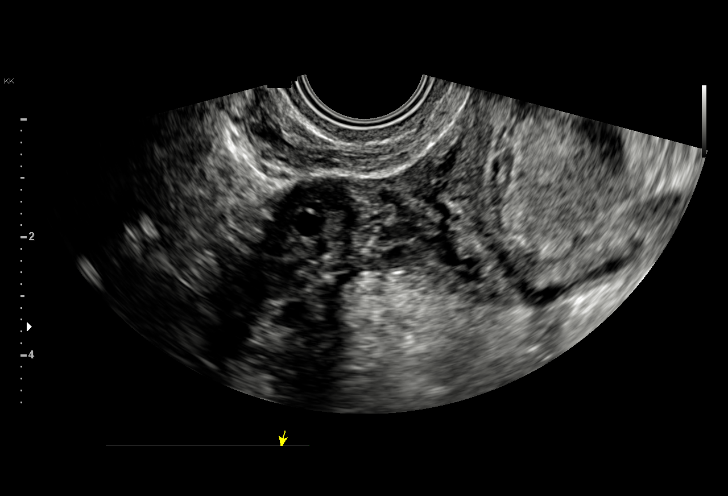
[im 49/63]
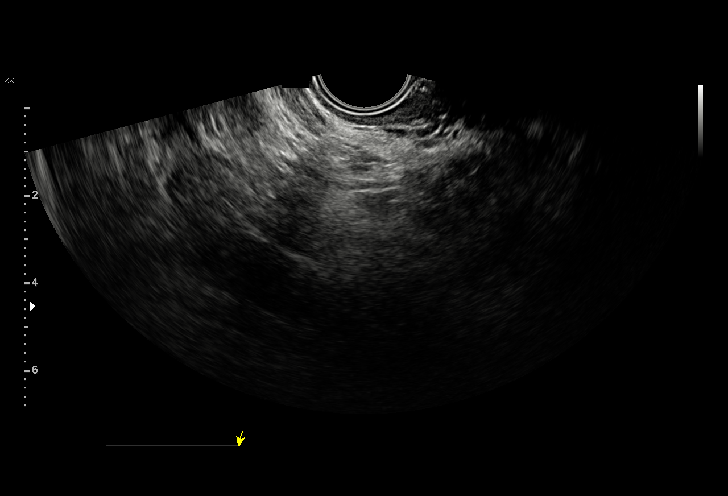
[im 53/63]
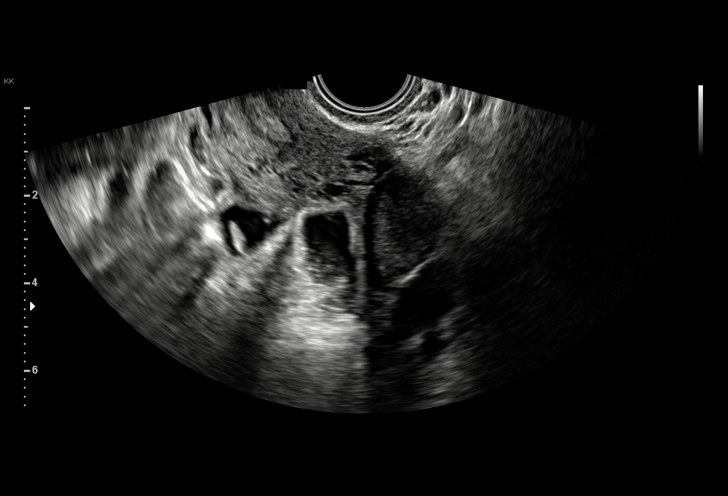
[im 58/63]
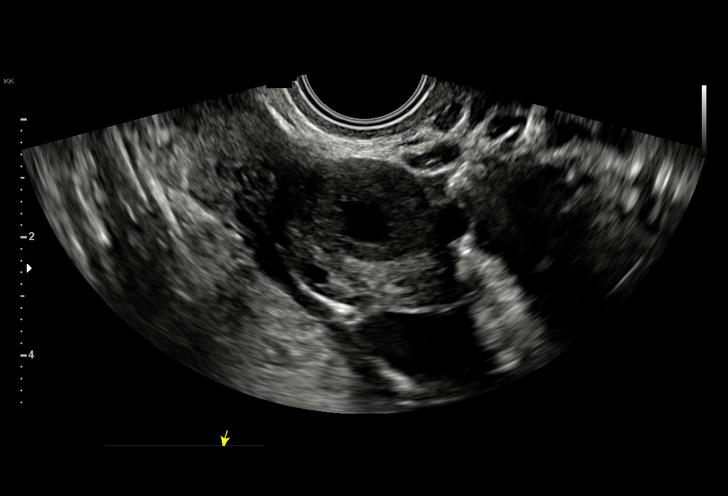
[im 63/63]
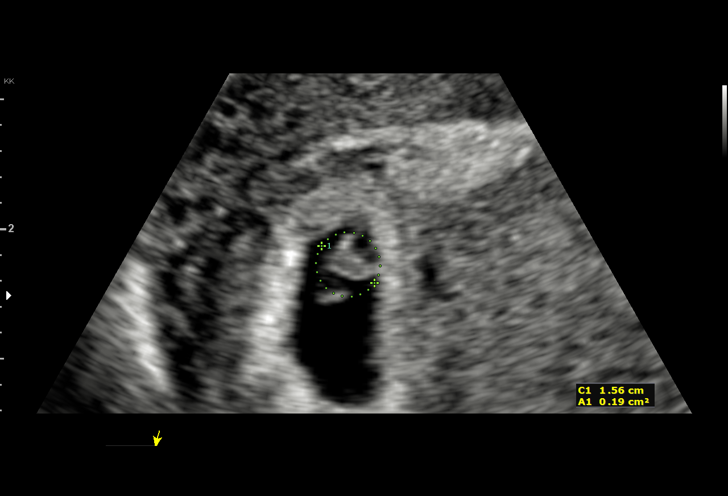

[15 of 28 positions shown; findings below may reference images not displayed]

FINDINGS: Intrauterine gestational sac: Single visualized.

Yolk sac:  Visualized.

Embryo:  Visualized.

Cardiac Activity: Visualized.

Heart Rate: 108 bpm

CRL:   4.27 mm   6 w 1 d                  US EDC: August 29, 2019.

Subchorionic hemorrhage:  Small subchronic hemorrhage is noted.

Maternal uterus/adnexae: No significant free fluid is noted.
Probable corpus luteum cyst seen in left ovary. Right ovary appears
normal.
IMPRESSION: Single live intrauterine gestation of 6 weeks 1 day. Small
subchronic hemorrhage is noted.

## 2020-04-09 ENCOUNTER — Encounter (HOSPITAL_COMMUNITY): Payer: Self-pay | Admitting: *Deleted

## 2020-04-09 ENCOUNTER — Other Ambulatory Visit: Payer: Self-pay

## 2020-04-09 ENCOUNTER — Inpatient Hospital Stay (HOSPITAL_COMMUNITY)
Admission: AD | Admit: 2020-04-09 | Discharge: 2020-04-09 | Disposition: A | Discharge status: Home / Self Care | Payer: Medicaid Other | Attending: Obstetrics and Gynecology | Admitting: Obstetrics and Gynecology

## 2020-04-09 ENCOUNTER — Inpatient Hospital Stay (HOSPITAL_COMMUNITY): Payer: Medicaid Other

## 2020-04-09 DIAGNOSIS — O209 Hemorrhage in early pregnancy, unspecified: Secondary | ICD-10-CM | POA: Diagnosis not present

## 2020-04-09 DIAGNOSIS — Z3A01 Less than 8 weeks gestation of pregnancy: Secondary | ICD-10-CM | POA: Diagnosis not present

## 2020-04-09 DIAGNOSIS — O4690 Antepartum hemorrhage, unspecified, unspecified trimester: Secondary | ICD-10-CM

## 2020-04-09 DIAGNOSIS — O039 Complete or unspecified spontaneous abortion without complication: Secondary | ICD-10-CM | POA: Diagnosis not present

## 2020-04-09 LAB — URINALYSIS, ROUTINE W REFLEX MICROSCOPIC: Glucose, UA: NEGATIVE mg/dL

## 2020-04-09 LAB — URINALYSIS, MICROSCOPIC (REFLEX): RBC / HPF: >50 RBC/hpf (ref 0–5)

## 2020-04-09 LAB — I-STAT BETA HCG BLOOD, ED (MC, WL, AP ONLY): I-stat hCG, quantitative: 91.8 m[IU]/mL — ABNORMAL HIGH (ref <5)

## 2020-04-09 LAB — HCG, QUANTITATIVE, PREGNANCY: hCG, Beta Chain, Quant, S: 77 m[IU]/mL — ABNORMAL HIGH (ref <5)

## 2020-04-09 NOTE — ED Provider Notes (Signed)
24 year old female presents for evaluation of abdominal cramping and vaginal bleeding.  Now having large clots.  Menstrual cycle last month was "light."  Recently had delivery  Vag delivery at [redacted]w[redacted]d in late Aug 2020 with [facility].  Pregnancy test here in ED positive.  Consult with MAU provider Hailey who agrees to accept patient in transfer.  Patient will be transferred to MAU for further work-up of abdominal pain and vaginal bleeding in setting of positive pregnancy test   MSE was initiated and I personally evaluated the patient and placed orders (if any) at  4:06 PM on April 09, 2020.  The patient appears stable so that the remainder of the MSE may be completed by another provider.   Casimira Sutphin A, PA-C 04/09/20 1606    Cathren Laine, MD 04/09/20 1954

## 2020-04-09 NOTE — MAU Note (Signed)
Sent up from ER. Pt had no idea that she was preg.  Presented bleeding and cramping.  Bleeding heavier then normal.  Started yesterday.  Spotted April 1 and 2.

## 2020-04-09 NOTE — ED Triage Notes (Signed)
States she started her period the first of the month states it was light, yest she was pushing her daughter in a stroller and she started having heavy vaginal bleeding. C/o lower abd. cramps

## 2020-04-09 NOTE — MAU Provider Note (Signed)
History     CSN: [id number]  Arrival date and time: 04/09/20 1244   First Provider Initiated Contact with Patient 04/09/20 1712      Chief Complaint  Patient presents with  . Vaginal Bleeding   Sarah Quinn is a 24 year old G3P2002 at estimated 5 week 5 day EGA who reported to the ED for vaginal bleeding, she did not know she was pregnant.  Vaginal Bleeding The patient's primary symptoms include vaginal bleeding. This is a new problem. The current episode started yesterday (she reports that she thought she had her period April 1 and 2). The problem occurs constantly. The problem has been unchanged. The patient is experiencing no pain. She is pregnant. Pertinent negatives include no abdominal pain, constipation, diarrhea, discolored urine, dysuria, fever, flank pain, hematuria, urgency or vomiting. The vaginal discharge was bloody. The vaginal bleeding is heavier than menses. She has not been passing clots. She has not been passing tissue. Nothing aggravates the symptoms. She has tried nothing for the symptoms. She is sexually active (with the same partner). She uses nothing for contraception. Her menstrual history has been irregular. (Recent delivery 7 months ago)    OB History    Gravida  3   Para  2   Term  2   Preterm  0   AB  0   Living  2     SAB  0   TAB  0   Ectopic  0   Multiple  0   Live Births  2           Past Medical History:  Diagnosis Date  . Hx of chlamydia infection   . Hx of gonorrhea   . Medical history non-contributory   . Syncope     Past Surgical History:  Procedure Laterality Date  . NO PAST SURGERIES      Family History  Problem Relation Age of Onset  . Healthy Mother   . Healthy Father     Social History   Tobacco Use  . Smoking status: Never Smoker  . Smokeless tobacco: Never Used  Substance Use Topics  . Alcohol use: No  . Drug use: Not Currently    Types: Marijuana    Comment: last smoked January 2020     Allergies: No Known Allergies  Medications Prior to Admission  Medication Sig Dispense Refill Last Dose  . ibuprofen (ADVIL) 600 MG tablet Take 1 tablet (600 mg total) by mouth every 6 (six) hours as needed for moderate pain. 30 tablet 0  at not taking  . Prenatal Vit-Fe Fumarate-FA (PRENATAL COMPLETE) 14-0.4 MG TABS Take 2 tablets by mouth daily. 60 each 0  at not taking    Review of Systems  Constitutional: Negative for fever.  HENT: Negative.   Eyes: Negative.   Respiratory: Negative.   Cardiovascular: Negative.   Gastrointestinal: Negative for abdominal pain, constipation, diarrhea and vomiting.  Endocrine: Negative.   Genitourinary: Positive for vaginal bleeding. Negative for dysuria, flank pain, hematuria and urgency.  Musculoskeletal: Negative.   Skin: Negative.   Allergic/Immunologic: Negative.   Neurological: Negative.   Hematological: Negative.   Psychiatric/Behavioral: Negative.    Physical Exam   Blood pressure 126/78, pulse 87, temperature 99 F (37.2 C), temperature source Oral, resp. rate 18, height 5\' 6"  (1.676 m), weight 81.9 kg, last menstrual period 02/29/2020, SpO2 100 %, unknown if currently breastfeeding.  Physical Exam  Nursing note and vitals reviewed. Constitutional: She is oriented to person, place, and  time. She appears well-developed and well-nourished.  HENT:  Head: Normocephalic and atraumatic.  Eyes: Pupils are equal, round, and reactive to light. Conjunctivae and EOM are normal.  Cardiovascular: Normal rate, regular rhythm and normal heart sounds.  Respiratory: Effort normal and breath sounds normal.  GI: Soft. Bowel sounds are normal.  Genitourinary:    Genitourinary Comments: Dark blood in the vaginal vault, cervical Os unable to be visualized   Musculoskeletal:        General: Normal range of motion.     Cervical back: Normal range of motion and neck supple.  Neurological: She is alert and oriented to person, place, and time. She has  normal reflexes.  Skin: Skin is warm and dry.  Psychiatric: She has a normal mood and affect. Her behavior is normal. Judgment and thought content normal.    MAU Course  Procedures  MDM - iSTAT HCG in [facility] 91 - bHCG quant and [place] ordered  Results for orders placed or performed during the hospital encounter of 04/09/20 (from the past 24 hour(s))  Urinalysis, Routine w reflex microscopic     Status: Abnormal   Collection Time: 04/09/20  1:37 PM  Result Value Ref Range   Color, Urine BROWN (A) YELLOW   APPearance CLOUDY (A) CLEAR   Specific Gravity, Urine  1.005 - 1.030    TEST NOT REPORTED DUE TO COLOR INTERFERENCE OF URINE PIGMENT   pH  5.0 - 8.0    TEST NOT REPORTED DUE TO COLOR INTERFERENCE OF URINE PIGMENT   Glucose, UA NEGATIVE NEGATIVE mg/dL   Hgb urine dipstick (A) NEGATIVE    TEST NOT REPORTED DUE TO COLOR INTERFERENCE OF URINE PIGMENT   Bilirubin Urine (A) NEGATIVE    TEST NOT REPORTED DUE TO COLOR INTERFERENCE OF URINE PIGMENT   Ketones, ur (A) NEGATIVE mg/dL    TEST NOT REPORTED DUE TO COLOR INTERFERENCE OF URINE PIGMENT   Protein, ur (A) NEGATIVE mg/dL    TEST NOT REPORTED DUE TO COLOR INTERFERENCE OF URINE PIGMENT   Nitrite (A) NEGATIVE    TEST NOT REPORTED DUE TO COLOR INTERFERENCE OF URINE PIGMENT   Leukocytes,Ua (A) NEGATIVE    TEST NOT REPORTED DUE TO COLOR INTERFERENCE OF URINE PIGMENT  Urinalysis, Microscopic (reflex)     Status: Abnormal   Collection Time: 04/09/20  1:37 PM  Result Value Ref Range   RBC / HPF >50 0 - 5 RBC/hpf   WBC, UA 21-50 0 - 5 WBC/hpf   Bacteria, UA FEW (A) NONE SEEN   Squamous Epithelial / LPF 0-5 0 - 5  I-Stat beta hCG blood, ED     Status: Abnormal   Collection Time: 04/09/20  2:31 PM  Result Value Ref Range   I-stat hCG, quantitative 91.8 (H) <5 mIU/mL   Comment 3          hCG, quantitative, pregnancy     Status: Abnormal   Collection Time: 04/09/20  5:31 PM  Result Value Ref Range   hCG, Beta Chain, Quant, S 77 (H) <5  mIU/mL   US OB LESS THAN 14 WEEKS WITH OB TRANSVAGINAL  Result Date: 04/09/2020 CLINICAL DATA:  Bleeding and cramping for 1 day EXAM: OBSTETRIC <14 WK [place] AND TRANSVAGINAL OB US TECHNIQUE: Both transabdominal and transvaginal ultrasound examinations were performed for complete evaluation of the gestation as well as the maternal uterus, adnexal regions, and pelvic cul-de-sac. Transvaginal technique was performed to assess early pregnancy. COMPARISON:  Ultrasound 01/04/2019 FINDINGS: Intrauterine gestational sac: None  Yolk sac:  Not Visualized. Embryo:  Not Visualized. Cardiac Activity: Not Visualized. Maternal uterus/adnexae: Normal appearance of the maternal uterus and endometrium without significant endometrial thickening or visible endometrial fluid collection. 4.6 cm simple appearing fluid attenuation cyst in the right ovary, no further evaluation warranted by size and imaging criteria. No visible ectopic gestation is seen. No free fluid in the pelvis. IMPRESSION: No intrauterine pregnancy visualized. Differential considerations would include early intrauterine pregnancy too early to visualize, spontaneous abortion, or occult ectopic pregnancy. Recommend close clinical followup and serial quantitative beta HCGs and ultrasounds. Electronically Signed   By: Kreg Shropshire M.D.   On: 04/09/2020 18:10    Assessment and Plan  Sarah Quinn is a 24 year old G3P2002 at estimated 5 week 5 day EGA who reported to the ED for vaginal bleeding -HCG quant 91, [place] did not seen a yolk sac or signs of ectopic -Called Dr. Dion Body, arrange f/u in office in 2 days for quant HCG -DC to home with strict return precautions  Sarah Quinn 04/09/2020, 6:35 PM

## 2020-04-09 NOTE — Discharge Instructions (Signed)
Miscarriage A miscarriage is the loss of an unborn baby (fetus) before the 20th week of pregnancy. Follow these instructions at home: Medicines   Take over-the-counter and prescription medicines only as told by your doctor.  If you were prescribed antibiotic medicine, take it as told by your doctor. Do not stop taking the antibiotic even if you start to feel better.  Do not take NSAIDs unless your doctor says that this is safe for you. NSAIDs include aspirin and ibuprofen. These medicines can cause bleeding. Activity  Rest as directed. Ask your doctor what activities are safe for you.  Have someone help you at home during this time. General instructions  Write down how many pads you use each day and how soaked they are.  Watch the amount of tissue or clumps of blood (blood clots) that you pass from your vagina. Save any large amounts of tissue for your doctor.  Do not use tampons, douche, or have sex until your doctor approves.  To help you and your partner with the process of grieving, talk with your doctor or seek counseling.  When you are ready, meet with your doctor to talk about steps you should take for your health. Also, talk with your doctor about steps to take to have a healthy pregnancy in the future.  Keep all follow-up visits as told by your doctor. This is important. Contact a doctor if:  You have a fever or chills.  You have vaginal discharge that smells bad.  You have more bleeding. Get help right away if:  You have very bad cramps or pain in your back or belly.  You pass clumps of blood that are walnut-sized or larger from your vagina.  You pass tissue that is walnut-sized or larger from your vagina.  You soak more than 1 regular pad in an hour.  You get light-headed or weak.  You faint (pass out).  You have feelings of sadness that do not go away, or you have thoughts of hurting yourself. Summary  A miscarriage is the loss of an unborn baby before  the 20th week of pregnancy.  Follow your doctor's instructions for home care. Keep all follow-up appointments.  To help you and your partner with the process of grieving, talk with your doctor or seek counseling. This information is not intended to replace advice given to you by your health care provider. Make sure you discuss any questions you have with your health care provider. Document Revised: 04/08/2019 Document Reviewed: 01/20/2017 Elsevier Patient Education  2020 Elsevier Inc.  

## 2020-06-09 ENCOUNTER — Other Ambulatory Visit: Payer: Self-pay

## 2020-06-09 ENCOUNTER — Inpatient Hospital Stay (HOSPITAL_COMMUNITY)
Admission: AD | Admit: 2020-06-09 | Discharge: 2020-06-09 | Disposition: A | Discharge status: Home / Self Care | Payer: Medicaid Other | Attending: Obstetrics and Gynecology | Admitting: Obstetrics and Gynecology

## 2020-06-09 ENCOUNTER — Encounter (HOSPITAL_COMMUNITY): Payer: Self-pay | Admitting: Obstetrics and Gynecology

## 2020-06-09 ENCOUNTER — Inpatient Hospital Stay (HOSPITAL_COMMUNITY): Payer: Medicaid Other

## 2020-06-09 DIAGNOSIS — O4691 Antepartum hemorrhage, unspecified, first trimester: Secondary | ICD-10-CM

## 2020-06-09 DIAGNOSIS — O209 Hemorrhage in early pregnancy, unspecified: Secondary | ICD-10-CM | POA: Insufficient documentation

## 2020-06-09 DIAGNOSIS — O3680X0 Pregnancy with inconclusive fetal viability, not applicable or unspecified: Secondary | ICD-10-CM | POA: Diagnosis not present

## 2020-06-09 DIAGNOSIS — O26891 Other specified pregnancy related conditions, first trimester: Secondary | ICD-10-CM | POA: Insufficient documentation

## 2020-06-09 DIAGNOSIS — Z3A01 Less than 8 weeks gestation of pregnancy: Secondary | ICD-10-CM | POA: Diagnosis not present

## 2020-06-09 DIAGNOSIS — R1030 Lower abdominal pain, unspecified: Secondary | ICD-10-CM | POA: Diagnosis not present

## 2020-06-09 DIAGNOSIS — B9689 Other specified bacterial agents as the cause of diseases classified elsewhere: Secondary | ICD-10-CM

## 2020-06-09 DIAGNOSIS — N760 Acute vaginitis: Secondary | ICD-10-CM | POA: Insufficient documentation

## 2020-06-09 DIAGNOSIS — N888 Other specified noninflammatory disorders of cervix uteri: Secondary | ICD-10-CM

## 2020-06-09 DIAGNOSIS — Z8759 Personal history of other complications of pregnancy, childbirth and the puerperium: Secondary | ICD-10-CM | POA: Diagnosis not present

## 2020-06-09 LAB — URINALYSIS, ROUTINE W REFLEX MICROSCOPIC
Bilirubin Urine: NEGATIVE
Glucose, UA: NEGATIVE mg/dL
Hgb urine dipstick: NEGATIVE
Ketones, ur: 5 mg/dL — AB
Leukocytes,Ua: NEGATIVE
Nitrite: POSITIVE — AB
Protein, ur: NEGATIVE mg/dL
Specific Gravity, Urine: 1.024 (ref 1.005–1.030)
pH: 6 (ref 5.0–8.0)

## 2020-06-09 LAB — WET PREP, GENITAL
Sperm: NONE SEEN
Trich, Wet Prep: NONE SEEN
Yeast Wet Prep HPF POC: NONE SEEN

## 2020-06-09 LAB — POCT PREGNANCY, URINE: Preg Test, Ur: POSITIVE — AB

## 2020-06-09 LAB — HCG, QUANTITATIVE, PREGNANCY: hCG, Beta Chain, Quant, S: 653 m[IU]/mL — ABNORMAL HIGH (ref <5)

## 2020-06-09 MED ORDER — METRONIDAZOLE 0.75 % VA GEL: 1.0000 | Freq: Every day | VAGINAL | 0 refills | Status: DC

## 2020-06-09 NOTE — MAU Provider Note (Signed)
History     CSN: [id number]  Arrival date and time: 06/09/20 2024   First Provider Initiated Contact with Patient 06/09/20 2140      Chief Complaint  Patient presents with  . Vaginal Bleeding   Sarah Quinn is a 24 y.o. [id number] at [redacted]w[redacted]d who has not established care.  She presents today for Vaginal Bleeding.  She states she went to the bathroom around 7pm and noticed some spotting with wiping.  She describes the blood as light pink and "just a little bit... a tad." However, upon arrival she did not notice any more spotting.  She does that some lower abdominal cramping started after arrival and is intermittent in nature.  She rates the pain a 2/10.  She denies sexual activity in the last 3 days.     OB History    Gravida  4   Para  2   Term  2   Preterm  0   AB  1   Living  2     SAB  1   TAB  0   Ectopic  0   Multiple  0   Live Births  2           Past Medical History:  Diagnosis Date  . Hx of chlamydia infection   . Hx of gonorrhea   . Medical history non-contributory   . Syncope     Past Surgical History:  Procedure Laterality Date  . NO PAST SURGERIES      Family History  Problem Relation Age of Onset  . Healthy Mother   . Healthy Father     Social History   Tobacco Use  . Smoking status: Never Smoker  . Smokeless tobacco: Never Used  Vaping Use  . Vaping Use: Never used  Substance Use Topics  . Alcohol use: No  . Drug use: Not Currently    Types: Marijuana    Comment: last smoked January 2020    Allergies: No Known Allergies  Medications Prior to Admission  Medication Sig Dispense Refill Last Dose  . Prenatal Vit-Fe Fumarate-FA (PRENATAL COMPLETE) 14-0.4 MG TABS Take 2 tablets by mouth daily. 60 each 0 06/09/2020 at Unknown time  . ibuprofen (ADVIL) 600 MG tablet Take 1 tablet (600 mg total) by mouth every 6 (six) hours as needed for moderate pain. 30 tablet 0     Review of Systems  Gastrointestinal: Positive for  abdominal pain. Negative for constipation, diarrhea, nausea and vomiting.  Genitourinary: Positive for vaginal bleeding. Negative for difficulty urinating, dysuria, pelvic pain and vaginal discharge.  Neurological: Negative for dizziness, light-headedness and headaches.   Physical Exam   Blood pressure 119/81, pulse 100, resp. rate 17, weight 83.3 kg, last menstrual period 05/02/2020, unknown if currently breastfeeding.  Physical Exam  Nursing note and vitals reviewed. Constitutional: She is oriented to person, place, and time.  HENT:  Head: Normocephalic and atraumatic.  Eyes: Conjunctivae are normal.  Cardiovascular: Normal rate, regular rhythm and normal heart sounds.  Respiratory: Effort normal and breath sounds normal. No respiratory distress.  GI: Soft. Normal appearance and bowel sounds are normal. There is no abdominal tenderness.  Genitourinary: Uterus is not enlarged and not tender. Cervix exhibits friability. Cervix exhibits no motion tenderness.    Vaginal discharge present.     No vaginal bleeding.  No bleeding in the vagina.    Genitourinary Comments: Speculum Exam: -Normal External Genitalia: Non tender, no apparent discharge at introitus.  -Vaginal Vault: Pink mucosa  with good rugae. Small amt brownish yellow watery discharge -wet prep collected -Cervix:Pink, no lesions, cysts, or polyps.  Appears closed. Ectropian appearance and friable-Bleeding stopped with pressure using faux swab. GC/CT collected -Bimanual Exam:  Uterus size difficult to assess, but not definitively enlarged.     Musculoskeletal:     Cervical back: Normal range of motion.  Neurological: She is alert and oriented to person, place, and time.  Skin: Skin is warm.  Psychiatric: Mood and thought content normal.    MAU Course  Procedures Results for orders placed or performed during the hospital encounter of 06/09/20 (from the past 24 hour(s))  Urinalysis, Routine w reflex microscopic     Status:  Abnormal   Collection Time: 06/09/20  8:42 PM  Result Value Ref Range   Color, Urine YELLOW YELLOW   APPearance CLEAR CLEAR   Specific Gravity, Urine 1.024 1.005 - 1.030   pH 6.0 5.0 - 8.0   Glucose, UA NEGATIVE NEGATIVE mg/dL   Hgb urine dipstick NEGATIVE NEGATIVE   Bilirubin Urine NEGATIVE NEGATIVE   Ketones, ur 5 (A) NEGATIVE mg/dL   Protein, ur NEGATIVE NEGATIVE mg/dL   Nitrite POSITIVE (A) NEGATIVE   Leukocytes,Ua NEGATIVE NEGATIVE   RBC / HPF 0-5 0 - 5 RBC/hpf   WBC, UA 0-5 0 - 5 WBC/hpf   Bacteria, UA RARE (A) NONE SEEN   Squamous Epithelial / LPF 0-5 0 - 5   Mucus PRESENT   Pregnancy, urine POC     Status: Abnormal   Collection Time: 06/09/20  8:43 PM  Result Value Ref Range   Preg Test, Ur POSITIVE (A) NEGATIVE  hCG, quantitative, pregnancy     Status: Abnormal   Collection Time: 06/09/20  9:09 PM  Result Value Ref Range   hCG, Beta Chain, Quant, S 653 (H) <5 mIU/mL  Wet prep, genital     Status: Abnormal   Collection Time: 06/09/20  9:50 PM   Specimen: PATH Cytology Cervicovaginal Ancillary Only  Result Value Ref Range   Yeast Wet Prep HPF POC NONE SEEN NONE SEEN   Trich, Wet Prep NONE SEEN NONE SEEN   Clue Cells Wet Prep HPF POC PRESENT (A) NONE SEEN   WBC, Wet Prep HPF POC MODERATE (A) NONE SEEN   Sperm NONE SEEN    US OB Transvaginal  Result Date: 06/09/2020 CLINICAL DATA:  Pregnant patient in first-trimester pregnancy with vaginal bleeding. Last menstrual period 05/02/2020. beta HCG 653. EXAM: TRANSVAGINAL OB ULTRASOUND TECHNIQUE: Transvaginal ultrasound was performed for complete evaluation of the gestation as well as the maternal uterus, adnexal regions, and pelvic cul-de-sac. COMPARISON:  Obstetric ultrasound 04/09/2020 with no intrauterine pregnancy visualized at that time. FINDINGS: Intrauterine gestational sac: Possible early. Yolk sac:  Not Visualized. Embryo:  Not Visualized. Cardiac Activity: Not Visualized. MSD: 2 mm, too small to calculate gestational  age 62 hemorrhage:  None visualized. Maternal uterus/adnexae: Suggestion of tiny early intrauterine gestational sac in the endometrial canal. Possible corpus luteal cyst in the left ovary, normal ovarian blood flow. Right ovary is normal. Small amount of simple free fluid in the cul-de-sac. IMPRESSION: Probable early intrauterine gestational sac, but no yolk sac, fetal pole, or cardiac activity yet visualized. Recommend follow-up quantitative B-HCG levels and follow-up US in 14 days to assess viability. This recommendation follows SRU consensus guidelines: Diagnostic Criteria for Nonviable Pregnancy Early in the First Trimester. [employer] Med 2013; 283:1517-61. Electronically Signed   By: Keith Rake M.D.   On: 06/09/2020 23:07  MDM Pelvic Exam; Wet Prep and GC/CT Labs: UA, UPT, CBC, hCG Ultrasound  Assessment and Plan  24 year old [id number] at 5.3weeks Vaginal bleeding  -Reviewed POC with patient. -Exam performed and findings discussed.  -Informed and educated on friable cervix.  -Cultures collected and pending.  -Patient expresses concern with pregnancy loss. -Reassurance given -Informed that testing today may not give definitive answer regarding pregnancy viability. -Will send for [place].   Cherre Robins 06/09/2020, 9:40 PM   Reassessment (11:18 PM) HCG at 653 No IUGS or YS  Bacterial Vaginosis  -[place] and lab results as above. -Results discussed with patient. -Rx for Metrogel 0.75% PV QHS x 5days sent to pharmacy on file.  -Informed that further testing necessary to establish viability of pregnancy. -Will plan for repeat quant in 48-60 hours.  Patient opts for repeat in MAU on Monday night at 9pm -Informed of anticipated wait time of 2 hours or ability to leave, but necessity of returning with abnormal or concerning results.  Patient verbalized understanding. -Bleeding Precautions given. -Encouraged to call or return to MAU if symptoms worsen or with the onset of new  symptoms. -Discharged to home in stable condition.  Cherre Robins MSN, CNM Advanced Practice Provider, [facility]

## 2020-06-09 NOTE — Discharge Instructions (Signed)
Vaginal Bleeding During Pregnancy, First Trimester  A small amount of bleeding from the vagina (spotting) is relatively common during early pregnancy. It usually stops on its own. Various things may cause bleeding or spotting during early pregnancy. Some bleeding may be related to the pregnancy, and some may not. In many cases, the bleeding is normal and is not a problem. However, bleeding can also be a sign of something serious. Be sure to tell your health care provider about any vaginal bleeding right away. Some possible causes of vaginal bleeding during the first trimester include:  Infection or inflammation of the cervix.  Growths (polyps) on the cervix.  Miscarriage or threatened miscarriage.  Pregnancy tissue developing outside of the uterus (ectopic pregnancy).  A mass of tissue developing in the uterus due to an egg being fertilized incorrectly (molar pregnancy). Follow these instructions at home: Activity  Follow instructions from your health care provider about limiting your activity. Ask what activities are safe for you.  If needed, make plans for someone to help with your regular activities.  Do not have sex or orgasms until your health care provider says that this is safe. General instructions  Take over-the-counter and prescription medicines only as told by your health care provider.  Pay attention to any changes in your symptoms.  Do not use tampons or douche.  Write down how many pads you use each day, how often you change pads, and how soaked (saturated) they are.  If you pass any tissue from your vagina, save the tissue so you can show it to your health care provider.  Keep all follow-up visits as told by your health care provider. This is important. Contact a health care provider if:  You have vaginal bleeding during any part of your pregnancy.  You have cramps or labor pains.  You have a fever. Get help right away if:  You have severe cramps in your  back or abdomen.  You pass large clots or a large amount of tissue from your vagina.  Your bleeding increases.  You feel light-headed or weak, or you faint.  You have chills.  You are leaking fluid or have a gush of fluid from your vagina. Summary  A small amount of bleeding (spotting) from the vagina is relatively common during early pregnancy.  Various things may cause bleeding or spotting in early pregnancy.  Be sure to tell your health care provider about any vaginal bleeding right away. This information is not intended to replace advice given to you by your health care provider. Make sure you discuss any questions you have with your health care provider. Document Revised: 04/05/2019 Document Reviewed: 03/19/2017 Elsevier Patient Education  2020 Elsevier Inc.  

## 2020-06-09 NOTE — MAU Note (Signed)
Pt reports to MAU stating she had 2 + pregnancy test at home. Pt states she was pregnant a few months ago and they followed her quants to zero at Dr. Lawana Chambers office. Pt states her LMP was May 5th. Pt states today she has noticed some light spotting when she wipes that just happened. No abdominal pain or other complaints.

## 2020-06-10 ENCOUNTER — Inpatient Hospital Stay (HOSPITAL_COMMUNITY)
Admission: AD | Admit: 2020-06-10 | Discharge: 2020-06-10 | Disposition: A | Discharge status: Home / Self Care | Payer: Medicaid Other | Source: Ambulatory Visit | Attending: Obstetrics and Gynecology | Admitting: Obstetrics and Gynecology

## 2020-06-10 ENCOUNTER — Other Ambulatory Visit: Payer: Self-pay

## 2020-06-10 DIAGNOSIS — O3680X0 Pregnancy with inconclusive fetal viability, not applicable or unspecified: Secondary | ICD-10-CM | POA: Diagnosis not present

## 2020-06-10 DIAGNOSIS — Z3A01 Less than 8 weeks gestation of pregnancy: Secondary | ICD-10-CM | POA: Diagnosis not present

## 2020-06-10 NOTE — Discharge Instructions (Signed)

## 2020-06-10 NOTE — MAU Provider Note (Signed)
  S Sarah Quinn is a 24 y.o. [phone] at approximately [redacted] weeks pregnant who presents to MAU today for repeat HCG.   O BP 106/73 (BP Location: Right Arm)   Pulse 100   Temp 99.1 F (37.3 C) (Oral)   Resp 18   LMP 05/02/2020 (Exact Date)  Physical Exam  A Medical screening exam complete [redacted] weeks pregnant Pregnancy of unknown location  Explained to patient that she is supposed to be here tomorrow night for repeat labs at the 48hr mark, not tonight. Patient verbalized understanding and will come back tomorrow.   P Discharge from MAU in stable condition List of options for follow-up given  Warning signs for worsening condition that would warrant emergency follow-up discussed Patient may return to MAU as needed for pregnancy related complaints  Rolm Bookbinder, CNM 06/10/2020 9:01 PM

## 2020-06-10 NOTE — MAU Note (Signed)
Pt reports she is here for follow up hcg level. Reports she was told to come back tonight at 9pm. No changes from last visit.

## 2020-06-11 ENCOUNTER — Inpatient Hospital Stay (HOSPITAL_COMMUNITY)
Admission: AD | Admit: 2020-06-11 | Discharge: 2020-06-11 | Disposition: A | Discharge status: Home / Self Care | Payer: Medicaid Other | Attending: Obstetrics and Gynecology | Admitting: Obstetrics and Gynecology

## 2020-06-11 ENCOUNTER — Other Ambulatory Visit: Payer: Self-pay

## 2020-06-11 DIAGNOSIS — Z3A00 Weeks of gestation of pregnancy not specified: Secondary | ICD-10-CM | POA: Diagnosis not present

## 2020-06-11 DIAGNOSIS — O3680X0 Pregnancy with inconclusive fetal viability, not applicable or unspecified: Secondary | ICD-10-CM | POA: Diagnosis not present

## 2020-06-11 LAB — HCG, QUANTITATIVE, PREGNANCY: hCG, Beta Chain, Quant, S: 1742 m[IU]/mL — ABNORMAL HIGH (ref <5)

## 2020-06-11 LAB — GC/CHLAMYDIA PROBE AMP (~~LOC~~) NOT AT ARMC
Chlamydia: NEGATIVE
Comment: NEGATIVE
Comment: NORMAL
Neisseria Gonorrhea: NEGATIVE

## 2020-06-11 NOTE — Discharge Instructions (Signed)
Human Chorionic Gonadotropin Test Why am I having this test? A human chorionic gonadotropin (hCG) test is done to determine whether you are pregnant. It can also be used:  To diagnose an abnormal pregnancy.  To determine whether you have had a failed pregnancy (miscarriage) or are at risk of one. What is being tested? This test checks the level of the human chorionic gonadotropin (hCG) hormone in the blood. This hormone is produced during pregnancy by the cells that form the placenta. The placenta is the organ that grows inside your womb (uterus) to nourish a developing baby. When you are pregnant, hCG can be detected in your blood or urine 7 to 8 days before your missed period. It continues to go up for the first 8-10 weeks of pregnancy. The presence of hCG in your blood can be measured with several different types of tests. You may have:  A urine test. ? Because this hormone is eliminated from your body by your kidneys, you may have a urine test to find out whether you are pregnant. A home pregnancy test detects whether there is hCG in your urine. ? A urine test only shows whether there is hCG in your urine. It does not measure how much.  A qualitative blood test. ? You may have this type of blood test to find out if you are pregnant. ? This blood test only shows whether there is hCG in your blood. It does not measure how much.  A quantitative blood test. ? This type of blood test measures the amount of hCG in your blood. ? You may have this test to:  Diagnose an abnormal pregnancy.  Check whether you have had a miscarriage.  Determine whether you are at risk of a miscarriage. What kind of sample is taken?     Two kinds of samples may be collected to test for the hCG hormone.  Blood. It is usually collected by inserting a needle into a blood vessel.  Urine. It is usually collected by urinating into a germ-free (sterile) specimen cup. It is best to collect the sample the first  time you urinate in the morning. How do I prepare for this test? No preparation is needed for a blood test.  For the urine test:  Let your health care provider know about: ? All medicines you are taking, including vitamins, herbs, creams, and over-the-counter medicines. ? Any blood in your urine. This may interfere with the result.  Do not drink too much fluid. Drink as you normally would, or as directed by your health care provider. How are the results reported? Depending on the type of test that you have, your test results may be reported as values. Your health care provider will compare your results to normal ranges that were established after testing a large group of people (reference ranges). Reference ranges may vary among labs and hospitals. For this test, common reference ranges that show absence of pregnancy are:  Quantitative hCG blood levels: less than 5 IU/L. Other results will be reported as either positive or negative. For this test, normal results (meaning the absence of pregnancy) are:  Negative for hCG in the urine test.  Negative for hCG in the qualitative blood test. What do the results mean? Urine and qualitative blood test  A negative result could mean: ? That you are not pregnant. ? That the test was done too early in your pregnancy to detect hCG in your blood or urine. If you still have other signs   of pregnancy, the test will be repeated.  A positive result means: ? That you are most likely pregnant. Your health care provider may confirm your pregnancy with an imaging study (ultrasound) of your uterus, if needed. Quantitative blood test Results of the quantitative hCG blood test will be interpreted as follows:  Less than 5 IU/L: You are most likely not pregnant.  Greater than 25 IU/L: You are most likely pregnant.  hCG levels that are higher than expected: ? You are pregnant with twins. ? You have abnormal growths in the uterus.  hCG levels that are  rising more slowly than expected: ? You have an ectopic pregnancy (also called a tubal pregnancy).  hCG levels that are falling: ? You may be having a miscarriage. Talk with your health care provider about what your results mean. Questions to ask your health care provider Ask your health care provider, or the department that is doing the test:  When will my results be ready?  How will I get my results?  What are my treatment options?  What other tests do I need?  What are my next steps? Summary  A human chorionic gonadotropin test is done to determine whether you are pregnant.  When you are pregnant, hCG can be detected in your blood or urine 7 to 8 days before your missed period. It continues to go up for the first 8-10 weeks of pregnancy.  Your hCG level can be measured with different types of tests. You may have a urine test, a qualitative blood test, or a quantitative blood test.  Talk with your health care provider about what your results mean. This information is not intended to replace advice given to you by your health care provider. Make sure you discuss any questions you have with your health care provider. Document Revised: 11/16/2017 Document Reviewed: 11/16/2017 Elsevier Patient Education  2020 Elsevier Inc.  

## 2020-06-11 NOTE — MAU Provider Note (Signed)
First Provider Initiated Contact with Patient 06/11/20 2055     S Ms. Sarah Quinn is a 24 y.o. [phone] female with pregnancy of unknown location who presents to MAU for repeat stat Quant hCG. She denies vaginal bleeding and pain.   O BP 116/79 (BP Location: Right Arm)   Pulse 86   Temp 98.4 F (36.9 C) (Oral)   Resp 17   LMP 05/02/2020 (Exact Date)    Physical Exam  Nursing note and vitals reviewed. HENT:  Mouth/Throat: Mucous membranes are dry.  Neurological: She is alert.  Skin: Skin is warm and dry.  Psychiatric: Mood, judgment and thought content normal.    A Medical screening exam complete Appropriate rise in Quant hCG (653 on 06/12, 1742 on 06/14)  P Discharge from MAU in stable condition Discussed recommendation for repeat ultrasound in 10-14 days Patient desires follow-up with [facility] MAU note forwarded to clinic, voicemail left with clinic staff  Clayton Bibles, CNM 06/12/2020 12:05 AM

## 2020-06-11 NOTE — MAU Note (Signed)
Pt reports to MAU for a f/u hcg level. Pt denies pain or bleeding.

## 2020-06-12 LAB — CULTURE, OB URINE: Culture: >=100000 — AB

## 2020-06-13 ENCOUNTER — Other Ambulatory Visit: Payer: Self-pay | Admitting: Advanced Practice Midwife

## 2020-06-13 MED ORDER — CEFADROXIL 500 MG PO CAPS: 500.0000 mg | ORAL_CAPSULE | Freq: Two times a day (BID) | ORAL | 0 refills | Status: AC

## 2020-06-13 NOTE — Progress Notes (Signed)
Rx for Duricef 500 mg BID x 7 days to pharmacy for UTI

## 2020-06-28 DIAGNOSIS — Z419 Encounter for procedure for purposes other than remedying health state, unspecified: Secondary | ICD-10-CM | POA: Diagnosis not present

## 2020-07-27 DIAGNOSIS — Z3481 Encounter for supervision of other normal pregnancy, first trimester: Secondary | ICD-10-CM | POA: Diagnosis not present

## 2020-07-29 DIAGNOSIS — Z419 Encounter for procedure for purposes other than remedying health state, unspecified: Secondary | ICD-10-CM | POA: Diagnosis not present

## 2020-08-08 ENCOUNTER — Inpatient Hospital Stay (HOSPITAL_COMMUNITY)
Admission: AD | Admit: 2020-08-08 | Discharge: 2020-08-08 | Disposition: A | Discharge status: Home / Self Care | Payer: Medicaid Other | Attending: Obstetrics & Gynecology | Admitting: Obstetrics & Gynecology

## 2020-08-08 ENCOUNTER — Encounter (HOSPITAL_COMMUNITY): Payer: Self-pay | Admitting: Obstetrics & Gynecology

## 2020-08-08 ENCOUNTER — Other Ambulatory Visit: Payer: Self-pay

## 2020-08-08 DIAGNOSIS — O99612 Diseases of the digestive system complicating pregnancy, second trimester: Secondary | ICD-10-CM | POA: Insufficient documentation

## 2020-08-08 DIAGNOSIS — K5901 Slow transit constipation: Secondary | ICD-10-CM | POA: Diagnosis not present

## 2020-08-08 DIAGNOSIS — R1032 Left lower quadrant pain: Secondary | ICD-10-CM | POA: Diagnosis present

## 2020-08-08 DIAGNOSIS — Z3A14 14 weeks gestation of pregnancy: Secondary | ICD-10-CM | POA: Diagnosis not present

## 2020-08-08 DIAGNOSIS — Z8619 Personal history of other infectious and parasitic diseases: Secondary | ICD-10-CM | POA: Insufficient documentation

## 2020-08-08 DIAGNOSIS — O99322 Drug use complicating pregnancy, second trimester: Secondary | ICD-10-CM | POA: Diagnosis not present

## 2020-08-08 DIAGNOSIS — F1290 Cannabis use, unspecified, uncomplicated: Secondary | ICD-10-CM | POA: Insufficient documentation

## 2020-08-08 DIAGNOSIS — O99611 Diseases of the digestive system complicating pregnancy, first trimester: Secondary | ICD-10-CM

## 2020-08-08 DIAGNOSIS — O219 Vomiting of pregnancy, unspecified: Secondary | ICD-10-CM | POA: Diagnosis not present

## 2020-08-08 LAB — WET PREP, GENITAL
Clue Cells Wet Prep HPF POC: NONE SEEN
Sperm: NONE SEEN
Trich, Wet Prep: NONE SEEN
Yeast Wet Prep HPF POC: NONE SEEN

## 2020-08-08 LAB — URINALYSIS, ROUTINE W REFLEX MICROSCOPIC
Bilirubin Urine: NEGATIVE
Glucose, UA: NEGATIVE mg/dL
Hgb urine dipstick: NEGATIVE
Ketones, ur: NEGATIVE mg/dL
Leukocytes,Ua: NEGATIVE
Nitrite: NEGATIVE
Protein, ur: NEGATIVE mg/dL
Specific Gravity, Urine: 1.027 (ref 1.005–1.030)
pH: 6 (ref 5.0–8.0)

## 2020-08-08 MED ORDER — PROMETHAZINE HCL 25 MG PO TABS: 12.5000 mg | ORAL_TABLET | Freq: Four times a day (QID) | ORAL | 0 refills | Status: DC | PRN

## 2020-08-08 MED ORDER — ACETAMINOPHEN 500 MG PO TABS
1000.0000 mg | ORAL_TABLET | Freq: Four times a day (QID) | ORAL | Status: DC | PRN
  Administered 2020-08-08: 1000 mg via ORAL
  Filled 2020-08-08: qty 2

## 2020-08-08 MED ORDER — POLYETHYLENE GLYCOL 3350 17 GM/SCOOP PO POWD
17.0000 g | Freq: Every day | ORAL | 0 refills | Status: DC
Start: 2020-08-08 — End: 2020-10-02

## 2020-08-08 NOTE — MAU Provider Note (Signed)
History     CSN: [id number]  Arrival date and time: 08/08/20 1126   First Provider Initiated Contact with Patient 08/08/20 1232      Chief Complaint  Patient presents with  . Nausea  . Emesis  . Abdominal Pain   24 y.o. [id number] @14 .0 wks presenting with episode of LLQ pain and N/V. Episode happened this am while at work. Describes pain as constant pressure, rates 4/10. Has not tried anything for the pain. Denies VB or discharge, but having some odor. Reports hx of constipation and had small, hard stool yesterday. No longer having nausea. Had some noodles and water today.   OB History    Gravida  4   Para  2   Term  2   Preterm  0   AB  1   Living  2     SAB  1   TAB  0   Ectopic  0   Multiple  0   Live Births  2           Past Medical History:  Diagnosis Date  . Hx of chlamydia infection   . Hx of gonorrhea   . Medical history non-contributory   . Syncope     Past Surgical History:  Procedure Laterality Date  . NO PAST SURGERIES      Family History  Problem Relation Age of Onset  . Healthy Mother   . Healthy Father     Social History   Tobacco Use  . Smoking status: Never Smoker  . Smokeless tobacco: Never Used  Vaping Use  . Vaping Use: Never used  Substance Use Topics  . Alcohol use: No  . Drug use: Not Currently    Types: Marijuana    Comment: last smoked January 2020    Allergies: No Known Allergies  No medications prior to admission.    Review of Systems  Constitutional: Negative for chills and fever.  Gastrointestinal: Positive for abdominal pain, constipation, nausea and vomiting.  Genitourinary: Negative for vaginal bleeding and vaginal discharge.   Physical Exam   Blood pressure (!) 87/47, pulse 60, temperature 98.1 F (36.7 C), temperature source Oral, resp. rate 18, height 5\' 6"  (1.676 m), weight 81.8 kg, last menstrual period 05/02/2020, SpO2 96 %, unknown if currently breastfeeding.  Physical Exam Vitals and  nursing note reviewed. Exam conducted with a chaperone present.  Constitutional:      Appearance: Normal appearance.  HENT:     Head: Normocephalic.  Cardiovascular:     Rate and Rhythm: Normal rate.  Pulmonary:     Effort: Pulmonary effort is normal. No respiratory distress.  Abdominal:     General: There is no distension.     Palpations: Abdomen is soft.     Tenderness: There is no abdominal tenderness. There is no guarding or rebound.  Genitourinary:    Comments: External: no lesions or erythema Uterus: + enlarged, anteverted, non tender, no CMT Adnexae: no masses, no tenderness left, no tenderness right Cervix closed Musculoskeletal:        General: Normal range of motion.  Skin:    General: Skin is warm and dry.  Neurological:     General: No focal deficit present.     Mental Status: She is alert and oriented to person, place, and time.  Psychiatric:        Mood and Affect: Mood normal.   FHT 154  Results for orders placed or performed during the hospital encounter of 08/08/20 (from  the past 24 hour(s))  Urinalysis, Routine w reflex microscopic Urine, Clean Catch     Status: None   Collection Time: 08/08/20 12:16 PM  Result Value Ref Range   Color, Urine YELLOW YELLOW   APPearance CLEAR CLEAR   Specific Gravity, Urine 1.027 1.005 - 1.030   pH 6.0 5.0 - 8.0   Glucose, UA NEGATIVE NEGATIVE mg/dL   Hgb urine dipstick NEGATIVE NEGATIVE   Bilirubin Urine NEGATIVE NEGATIVE   Ketones, ur NEGATIVE NEGATIVE mg/dL   Protein, ur NEGATIVE NEGATIVE mg/dL   Nitrite NEGATIVE NEGATIVE   Leukocytes,Ua NEGATIVE NEGATIVE  Wet prep, genital     Status: Abnormal   Collection Time: 08/08/20 12:42 PM   Specimen: Vaginal  Result Value Ref Range   Yeast Wet Prep HPF POC NONE SEEN NONE SEEN   Trich, Wet Prep NONE SEEN NONE SEEN   Clue Cells Wet Prep HPF POC NONE SEEN NONE SEEN   WBC, Wet Prep HPF POC MODERATE (A) NONE SEEN   Sperm NONE SEEN    MAU Course  Procedures Tylenol   MDM Labs ordered and reviewed. Pain improved after Tylenol. Suspect GI etiology and recommend Miralax. No signs of threatened SAB. Pt requests Rx for nausea if recurs. Stable for discharge home.   Assessment and Plan   1. [redacted] weeks gestation of pregnancy   2. Slow transit constipation   3. Nausea and vomiting in pregnancy    Discharge home Follow up at [facility] as scheduled SAB precautions Increase water and fiber Rx Miralax Rx Phenergan  Allergies as of 08/08/2020   No Known Allergies     Medication List    STOP taking these medications   metroNIDAZOLE 0.75 % vaginal gel Commonly known as: METROGEL VAGINAL     TAKE these medications   polyethylene glycol powder 17 GM/SCOOP powder Commonly known as: GLYCOLAX/MIRALAX Take 17 g by mouth daily.   Prenatal Complete 14-0.4 MG Tabs Take 2 tablets by mouth daily.   promethazine 25 MG tablet Commonly known as: PHENERGAN Take 0.5-1 tablets (12.5-25 mg total) by mouth every 6 (six) hours as needed for nausea or vomiting.      Donette Larry, CNM 08/08/2020, 3:08 PM

## 2020-08-08 NOTE — MAU Note (Signed)
Presents with c/o N&V, unable to keep anything down.  Reports vomited 4x in 24 hours.  Also states having left lower abdominal pain, reports questionable hx of ovarian cyst.  Denies VB or LOF.

## 2020-08-08 NOTE — Progress Notes (Signed)
Aymptomatic and laying on left side when VS were taken

## 2020-08-08 NOTE — Discharge Instructions (Signed)
Constipation, Adult Constipation is when a person:  Poops (has a bowel movement) fewer times in a week than normal.  Has a hard time pooping.  Has poop that is dry, hard, or bigger than normal. Follow these instructions at home: Eating and drinking   Eat foods that have a lot of fiber, such as: ? Fresh fruits and vegetables. ? Whole grains. ? Beans.  Eat less of foods that are high in fat, low in fiber, or overly processed, such as: ? [place] fries. ? Hamburgers. ? Cookies. ? Candy. ? Soda.  Drink enough fluid to keep your pee (urine) clear or pale yellow. General instructions  Exercise regularly or as told by your doctor.  Go to the restroom when you feel like you need to poop. Do not hold it in.  Take over-the-counter and prescription medicines only as told by your doctor. These include any fiber supplements.  Do pelvic floor retraining exercises, such as: ? Doing deep breathing while relaxing your lower belly (abdomen). ? Relaxing your pelvic floor while pooping.  Watch your condition for any changes.  Keep all follow-up visits as told by your doctor. This is important. Contact a doctor if:  You have pain that gets worse.  You have a fever.  You have not pooped for 4 days.  You throw up (vomit).  You are not hungry.  You lose weight.  You are bleeding from the anus.  You have thin, pencil-like poop (stool). Get help right away if:  You have a fever, and your symptoms suddenly get worse.  You leak poop or have blood in your poop.  Your belly feels hard or bigger than normal (is bloated).  You have very bad belly pain.  You feel dizzy or you faint. This information is not intended to replace advice given to you by your health care provider. Make sure you discuss any questions you have with your health care provider. Document Revised: 11/27/2017 Document Reviewed: 06/04/2016 Elsevier Patient Education  2020 Elsevier Inc.   Abdominal Pain During  Pregnancy  Belly (abdominal) pain is common during pregnancy. There are many possible causes. Most of the time, it is not a serious problem. Other times, it can be a sign that something is wrong with the pregnancy. Always tell your doctor if you have belly pain. Follow these instructions at home:  Do not have sex or put anything in your vagina until your pain goes away completely.  Get plenty of rest until your pain gets better.  Drink enough fluid to keep your pee (urine) pale yellow.  Take over-the-counter and prescription medicines only as told by your doctor.  Keep all follow-up visits as told by your doctor. This is important. Contact a doctor if:  Your pain continues or gets worse after resting.  You have lower belly pain that: ? Comes and goes at regular times. ? Spreads to your back. ? Feels like menstrual cramps.  You have pain or burning when you pee (urinate). Get help right away if:  You have a fever or chills.  You have vaginal bleeding.  You are leaking fluid from your vagina.  You are passing tissue from your vagina.  You throw up (vomit) for more than 24 hours.  You have watery poop (diarrhea) for more than 24 hours.  Your baby is moving less than usual.  You feel very weak or faint.  You have shortness of breath.  You have very bad pain in your upper belly. Summary  Belly (abdominal) pain is common during pregnancy. There are many possible causes.  If you have belly pain during pregnancy, tell your doctor right away.  Keep all follow-up visits as told by your doctor. This is important. This information is not intended to replace advice given to you by your health care provider. Make sure you discuss any questions you have with your health care provider. Document Revised: 04/04/2019 Document Reviewed: 03/19/2017 Elsevier Patient Education  2020 [employer].

## 2020-08-09 LAB — GC/CHLAMYDIA PROBE AMP (~~LOC~~) NOT AT ARMC
Chlamydia: NEGATIVE
Comment: NEGATIVE
Comment: NORMAL
Neisseria Gonorrhea: NEGATIVE

## 2020-10-02 ENCOUNTER — Inpatient Hospital Stay (HOSPITAL_COMMUNITY)
Admission: AD | Admit: 2020-10-02 | Discharge: 2020-10-02 | Disposition: A | Discharge status: Home / Self Care | Payer: Medicaid Other | Attending: Obstetrics & Gynecology | Admitting: Obstetrics & Gynecology

## 2020-10-02 ENCOUNTER — Encounter (HOSPITAL_COMMUNITY): Payer: Self-pay | Admitting: Obstetrics & Gynecology

## 2020-10-02 ENCOUNTER — Other Ambulatory Visit: Payer: Self-pay

## 2020-10-02 DIAGNOSIS — W108XXA Fall (on) (from) other stairs and steps, initial encounter: Secondary | ICD-10-CM

## 2020-10-02 DIAGNOSIS — Z3A20 20 weeks gestation of pregnancy: Secondary | ICD-10-CM

## 2020-10-02 DIAGNOSIS — M545 Low back pain, unspecified: Secondary | ICD-10-CM

## 2020-10-02 DIAGNOSIS — O9A212 Injury, poisoning and certain other consequences of external causes complicating pregnancy, second trimester: Secondary | ICD-10-CM

## 2020-10-02 MED ORDER — ACETAMINOPHEN 500 MG PO TABS
1000.0000 mg | ORAL_TABLET | Freq: Once | ORAL | Status: AC
  Administered 2020-10-02: 1000 mg via ORAL
  Filled 2020-10-02: qty 2

## 2020-10-02 NOTE — Discharge Instructions (Signed)
Musculoskeletal Pain Musculoskeletal pain refers to aches and pains in your bones, joints, muscles, and the tissues that surround them. This pain can occur in any part of the body. It can last for a short time (acute) or a long time (chronic). A physical exam, lab tests, and imaging studies may be done to find the cause of your musculoskeletal pain. Follow these instructions at home:  Lifestyle  Try to control or lower your stress levels. Stress increases muscle tension and can worsen musculoskeletal pain. It is important to recognize when you are anxious or stressed and learn ways to manage it. This may include: ? Meditation or yoga. ? Cognitive or behavioral therapy. ? Acupuncture or massage therapy.  You may continue all activities unless the activities cause more pain. When the pain gets better, slowly resume your normal activities. Gradually increase the intensity and duration of your activities or exercise. Managing pain, stiffness, and swelling  Take over-the-counter and prescription medicines only as told by your health care provider.  When your pain is severe, bed rest may be helpful. Lie or sit in any position that is comfortable, but get out of bed and walk around at least every couple of hours.  If directed, apply heat to the affected area as often as told by your health care provider. Use the heat source that your health care provider recommends, such as a moist heat pack or a heating pad. ? Place a towel between your skin and the heat source. ? Leave the heat on for 20-30 minutes. ? Remove the heat if your skin turns bright red. This is especially important if you are unable to feel pain, heat, or cold. You may have a greater risk of getting burned.  If directed, put ice on the painful area. ? Put ice in a plastic bag. ? Place a towel between your skin and the bag. ? Leave the ice on for 20 minutes, 2-3 times a day. General instructions  Your health care provider may  recommend that you see a physical therapist. This person can help you come up with a safe exercise program. Do any exercises as told by your physical therapist.  Keep all follow-up visits, including any physical therapy visits, as told by your health care providers. This is important. Contact a health care provider if:  Your pain gets worse.  Medicines do not help ease your pain.  You cannot use the part of your body that hurts, such as your arm, leg, or neck.  You have trouble sleeping.  You have trouble doing your normal activities. Get help right away if:  You have a new injury and your pain is worse or different.  You feel numb or you have tingling in the painful area. Summary  Musculoskeletal pain refers to aches and pains in your bones, joints, muscles, and the tissues that surround them.  This pain can occur in any part of the body.  Your health care provider may recommend that you see a physical therapist. This person can help you come up with a safe exercise program. Do any exercises as told by your physical therapist.  Lower your stress level. Stress can worsen musculoskeletal pain. Ways to lower stress may include meditation, yoga, cognitive or behavioral therapy, acupuncture, and massage therapy. This information is not intended to replace advice given to you by your health care provider. Make sure you discuss any questions you have with your health care provider. Document Revised: 11/27/2017 Document Reviewed: 01/14/2017 Elsevier  Patient Education  [employer].   Preventing Injuries During Pregnancy Trauma is the most common cause of injury and death in pregnant women. This can also result in serious harm to the baby or even death. How can injuries affect my pregnancy? Your baby is protected in the womb (uterus) by a sac filled with fluid (amniotic sac). Your baby can be harmed if there is a direct blow to your abdomen and pelvis. Trauma may be caused  by:  Falls. These are more common in the second and third trimester of pregnancy.  Automobile accidents.  Domestic violence or assault.  Severe burns, such as from fire or electricity. These injuries can result in:  Tearing of your uterus.  The placenta pulling away from the wall of the uterus (placental abruption).  The amniotic sac breaking open (rupture of membranes).  Blockage or decrease in the blood supply to your baby.  Going into labor earlier than expected.  Severe injuries to other parts of your body, such as your brain, spine, heart, lungs, or other organs. Minor falls and low-impact automobile accidents do not usually harm your baby, even if they cause a little harm to you. What can I do to lower my risk? Safety  Remove slippery rugs and loose objects on the floor. They increase your risk of tripping or slipping.  Wear comfortable shoes that have a good grip on the sole. Do not wear high-heeled shoes.  Always wear your seat belt properly when riding in a car. Use both the lap and shoulder belt, with the lap belt below your abdomen. Always practice safe driving. Do not ride on a motorcycle while pregnant. Activity  Avoid walking on wet or slippery floors.  Do not participate in rough and violent activities or sports.  Avoid high-risk situations and activities such as: ? Lifting heavy pots of boiling or hot liquids. ? Fixing electrical problems. ? Being near fires or starting fires. General instructions  Take over-the-counter and prescription medicines only as told by your health care provider.  Know your blood type and the father's blood type in case you develop vaginal bleeding or experience an injury for which a blood transfusion is needed.  Spousal abuse can be a serious cause of trauma during pregnancy. If you are a victim of domestic violence or assault: ? Call your local emergency services (911 in the [place] ? Contact the [employer] Violence  Hotline for help and support. When should I seek immediate medical care? Get help right away if:  You fall on your abdomen or experience any serious blow to your abdomen.  You develop stiffness in your neck or pain after a fall or from other trauma.  You develop a headache or vision problems after a fall or from other trauma.  You do not feel the baby moving after a fall or trauma, or you feel that the baby is not moving as much as before the fall or trauma.  You have been the victim of domestic violence or any other kind of physical attack.  You have been in a car accident.  You develop vaginal bleeding.  You have fluid leaking from the vagina.  You develop uterine contractions. Symptoms include pelvic cramping, pain, or serious low back pain.  You become weak, faint, or have uncontrolled vomiting after trauma.  You have a serious burn. This includes burns to the face, neck, hands, or genitals, or burns greater than the size of your palm anywhere else. Summary  Trauma  is the most common cause of injury and death in pregnant women and can also lead to injury or death of the baby.  Falls, automobile accidents, domestic violence or assault, and severe burns can injure you or your baby. Make sure to get medical help right away if you experience any of these during your pregnancy.  Take steps to prevent slips or falls in your home, such as avoiding slippery floors and removing loose rugs.  Always wear your seat belt properly when riding in a car. Practice safe driving. This information is not intended to replace advice given to you by your health care provider. Make sure you discuss any questions you have with your health care provider. Document Revised: 04/14/2019 Document Reviewed: 12/24/2016 Elsevier Patient Education  2020 [employer].

## 2020-10-02 NOTE — MAU Provider Note (Signed)
History     CSN: [id number]  Arrival date and time: 10/02/20 1021   First Provider Initiated Contact with Patient 10/02/20 1115      Chief Complaint  Patient presents with  . Fall  . Back Pain   Sarah Quinn is a 24 y.o. [id number] at [redacted]w[redacted]d who presents for back pain after a fall. States she missed a step & slid down 4 stairs. Landed on her bottom. Did not hit her abdomen. Incident occurred around 830 this morning. Since then has had low back pain. Has not treated symptoms. Denies abdominal pain, leaking of fluid, or vaginal bleeding.  Goes to [facility] for prenatal care.   Location: sacrum Quality: aching, sharp Severity: 4/10 on pain scale Duration: 3 hours Timing: constant Modifying factors: nothing makes better or worse Associated signs and symptoms: none    OB History    Gravida  4   Para  2   Term  2   Preterm  0   AB  1   Living  2     SAB  1   TAB  0   Ectopic  0   Multiple  0   Live Births  2           Past Medical History:  Diagnosis Date  . Hx of chlamydia infection   . Hx of gonorrhea   . Syncope     Past Surgical History:  Procedure Laterality Date  . NO PAST SURGERIES      Family History  Problem Relation Age of Onset  . Healthy Mother   . Healthy Father     Social History   Tobacco Use  . Smoking status: Never Smoker  . Smokeless tobacco: Never Used  Vaping Use  . Vaping Use: Never used  Substance Use Topics  . Alcohol use: No  . Drug use: Not Currently    Types: Marijuana    Comment: last smoked January 2020    Allergies: No Known Allergies  No medications prior to admission.    Review of Systems  Constitutional: Negative.   Gastrointestinal: Negative.   Genitourinary: Negative.   Musculoskeletal: Positive for back pain.   Physical Exam   Blood pressure (!) 112/59, pulse 76, temperature 98.2 F (36.8 C), temperature source Oral, resp. rate 16, height 5\' 6"  (1.676 m), weight 81.9 kg, last  menstrual period 05/02/2020, SpO2 98 %, unknown if currently breastfeeding.  Physical Exam Vitals and nursing note reviewed.  Constitutional:      General: She is not in acute distress.    Appearance: Normal appearance. She is normal weight.  HENT:     Head: Normocephalic and atraumatic.  Pulmonary:     Effort: Pulmonary effort is normal. No respiratory distress.  Musculoskeletal:     Lumbar back: Normal. No swelling, deformity, signs of trauma or bony tenderness.  Neurological:     Mental Status: She is alert.     Sensory: Sensation is intact.     Motor: Motor function is intact.     Deep Tendon Reflexes:     Reflex Scores:      Patellar reflexes are 2+ on the right side and 2+ on the left side.    MAU Course  Procedures No results found for this or any previous visit (from the past 24 hour(s)).  MDM Fetal heart tones present via doppler. Denies abdominal pain, vaginal bleeding, or LOF. Abdomen soft & non tender. Did not hit abdomen during fall.  Given tylenol & hot packs while in MAU Reviewed expectations & treatment of symptoms at home. Reviewed reasons to return to MAU vs urgent care or ED.   Assessment and Plan   1. Fall down stairs, initial encounter  -take tylenol, alternate ice & heat to affected area -return to MAU for abdominal pain, vaginal bleeding, or LOF -go to urgent care or ED for worsening back pain, numbness of legs, or incontinence  2. Traumatic injury during pregnancy in second trimester  -no abdominal trauma. FHT obtained while in MAU. No ob complaints  3. [redacted] weeks gestation of pregnancy      Judeth Horn 10/02/2020, 2:40 PM

## 2020-10-02 NOTE — MAU Note (Signed)
Reports fell this morning @ 0830, fell onto her butt while walking down the stairs. States having some lower back pain since fall, no medication taken for pain/discomfort. Reports didn't strike abdomen.  Denies VB or LOF.

## 2020-12-03 ENCOUNTER — Other Ambulatory Visit: Payer: Self-pay

## 2020-12-03 ENCOUNTER — Encounter (HOSPITAL_COMMUNITY): Payer: Self-pay | Admitting: Obstetrics and Gynecology

## 2020-12-03 ENCOUNTER — Inpatient Hospital Stay (HOSPITAL_COMMUNITY)
Admission: AD | Admit: 2020-12-03 | Discharge: 2020-12-03 | Disposition: A | Discharge status: Home / Self Care | Payer: Medicaid Other | Attending: Obstetrics and Gynecology | Admitting: Obstetrics and Gynecology

## 2020-12-03 DIAGNOSIS — Z3A29 29 weeks gestation of pregnancy: Secondary | ICD-10-CM | POA: Diagnosis not present

## 2020-12-03 DIAGNOSIS — O99893 Other specified diseases and conditions complicating puerperium: Secondary | ICD-10-CM | POA: Diagnosis not present

## 2020-12-03 DIAGNOSIS — M545 Low back pain, unspecified: Secondary | ICD-10-CM | POA: Insufficient documentation

## 2020-12-03 DIAGNOSIS — M549 Dorsalgia, unspecified: Secondary | ICD-10-CM | POA: Diagnosis not present

## 2020-12-03 DIAGNOSIS — O26893 Other specified pregnancy related conditions, third trimester: Secondary | ICD-10-CM | POA: Insufficient documentation

## 2020-12-03 MED ORDER — CYCLOBENZAPRINE HCL 5 MG PO TABS
10.0000 mg | ORAL_TABLET | Freq: Once | ORAL | Status: DC
  Filled 2020-12-03: qty 2

## 2020-12-03 MED ORDER — CYCLOBENZAPRINE HCL 10 MG PO TABS: 10.0000 mg | ORAL_TABLET | Freq: Two times a day (BID) | ORAL | 0 refills | Status: DC | PRN

## 2020-12-03 NOTE — MAU Note (Signed)
Having lower back pain.  Started on Thurs, comes and goes.  Can't really sleep right, can't get comfortable. Worse when she is standing at work.  Denies GI or GU problems.

## 2020-12-03 NOTE — Discharge Instructions (Signed)
You can also put ice or heat to your back as needed for pain

## 2020-12-03 NOTE — MAU Provider Note (Signed)
History     CSN: [id number]  Arrival date and time: 12/03/20 1056   First Provider Initiated Contact with Patient 12/03/20 1430      Chief Complaint  Patient presents with  . Back Pain   Ms. Sarah Quinn is a 24 y.o. year old [id number] female at [redacted]w[redacted]d weeks gestation who presents to MAU reporting lower back pain since Thursday 11/29/2020. The pain is causing her to not be able to get comfortable or sleep. The pain is worse while she is standing at work. She has no urinary or  Complaints. She has tried taking Tylenol and using a heating pad without relief. She receives her [facility] with [facility]; next appt on Friday.   OB History    Gravida  4   Para  2   Term  2   Preterm  0   AB  1   Living  2     SAB  1   TAB  0   Ectopic  0   Multiple  0   Live Births  2           Past Medical History:  Diagnosis Date  . Hx of chlamydia infection   . Hx of gonorrhea   . Syncope     Past Surgical History:  Procedure Laterality Date  . NO PAST SURGERIES      Family History  Problem Relation Age of Onset  . Healthy Mother   . Healthy Father     Social History   Tobacco Use  . Smoking status: Never Smoker  . Smokeless tobacco: Never Used  Vaping Use  . Vaping Use: Never used  Substance Use Topics  . Alcohol use: No  . Drug use: Not Currently    Types: Marijuana    Comment: last smoked January 2020    Allergies: No Known Allergies  Medications Prior to Admission  Medication Sig Dispense Refill Last Dose  . Prenatal Vit-Fe Fumarate-FA (PRENATAL COMPLETE) 14-0.4 MG TABS Take 2 tablets by mouth daily. 60 each 0 12/03/2020 at Unknown time    Review of Systems  Constitutional: Negative.   HENT: Negative.   Eyes: Negative.   Respiratory: Negative.   Cardiovascular: Negative.   Gastrointestinal: Negative.   Endocrine: Negative.   Genitourinary: Negative.   Musculoskeletal: Positive for back pain (lower).  Skin: Negative.   Allergic/Immunologic:  Negative.   Neurological: Negative.   Hematological: Negative.   Psychiatric/Behavioral: Negative.    Physical Exam   Blood pressure 107/69, pulse 80, temperature 98.6 F (37 C), temperature source Oral, resp. rate 16, height 5\' 6"  (1.676 m), weight 85.9 kg, last menstrual period 05/02/2020, SpO2 98 %, unknown if currently breastfeeding.  Physical Exam Vitals and nursing note reviewed.  Constitutional:      Appearance: Normal appearance. She is normal weight.  HENT:     Head: Normocephalic and atraumatic.  Cardiovascular:     Rate and Rhythm: Normal rate.     Pulses: Normal pulses.  Pulmonary:     Effort: Pulmonary effort is normal.  Abdominal:     Palpations: Abdomen is soft.     Tenderness: There is no right CVA tenderness or left CVA tenderness.  Genitourinary:    Comments: Not examined Musculoskeletal:        General: Normal range of motion.     Cervical back: Normal range of motion.  Skin:    General: Skin is warm and dry.  Neurological:     Mental Status: She  is alert and oriented to person, place, and time.  Psychiatric:        Mood and Affect: Mood normal.        Behavior: Behavior normal.        Thought Content: Thought content normal.        Judgment: Judgment normal.    REACTIVE NST - FHR: 130 bpm / moderate variability / accels present / decels absent / TOCO: none   MAU Course  Procedures  MDM CCUA -- not done; urine misplaced Offered Flexeril 10 mg -- patient driving self to hospital; has no way to get home   Assessment and Plan  Back pain affecting pregnancy in third trimester - Plan: Discharge patient - Rx for Flexeril 10 mg every 12 hrs prn pain - Information provided on back pain in pregnancy  - Advised to stay well-hydrated - Keep scheduled appt with [facility] on Friday 12/07/2020 - Patient verbalized an understanding of the plan of care and agrees.   Raelyn Mora, CNM 12/03/2020, 2:30 PM

## 2020-12-29 NOTE — L&D Delivery Note (Signed)
Delivery Note Patient found to be complete and +1 station with ruptured membranes upon my arrival to room. At 7:44 AM a viable and healthy female was delivered via  (Presentation: LOA).  APGAR:9 , 9; weight   - see infant's chart Placenta status: Spontaneous, Intact.  Cord: 3 vessels  with the following complications: none  .  Cord pH: Not collected  Cytotec given per rectum and 1g TXA for prophylaxis.  Anesthesia: Epidural Episiotomy: None Lacerations:  None Suture Repair: NA Est. Blood Loss (mL):  250cc  Mom to postpartum.  Baby to Couplet care / Skin to Skin.  Steva Ready 02/06/2021, 7:54 AM

## 2021-01-21 ENCOUNTER — Other Ambulatory Visit: Payer: Self-pay

## 2021-01-21 ENCOUNTER — Inpatient Hospital Stay (HOSPITAL_COMMUNITY)
Admission: AD | Admit: 2021-01-21 | Discharge: 2021-01-21 | Disposition: A | Discharge status: Home / Self Care | Payer: Medicaid Other | Source: Home / Self Care | Attending: Obstetrics and Gynecology | Admitting: Obstetrics and Gynecology

## 2021-01-21 ENCOUNTER — Encounter (HOSPITAL_COMMUNITY): Payer: Self-pay | Admitting: Obstetrics and Gynecology

## 2021-01-21 ENCOUNTER — Encounter (HOSPITAL_COMMUNITY): Payer: Self-pay | Admitting: Obstetrics & Gynecology

## 2021-01-21 ENCOUNTER — Inpatient Hospital Stay (HOSPITAL_COMMUNITY)
Admission: AD | Admit: 2021-01-21 | Discharge: 2021-01-22 | DRG: 833 | Disposition: A | Discharge status: Home / Self Care | Payer: Medicaid Other | Attending: Obstetrics and Gynecology | Admitting: Obstetrics and Gynecology

## 2021-01-21 DIAGNOSIS — Z20822 Contact with and (suspected) exposure to covid-19: Secondary | ICD-10-CM | POA: Diagnosis present

## 2021-01-21 DIAGNOSIS — Z3A37 37 weeks gestation of pregnancy: Secondary | ICD-10-CM | POA: Insufficient documentation

## 2021-01-21 DIAGNOSIS — O479 False labor, unspecified: Secondary | ICD-10-CM

## 2021-01-21 DIAGNOSIS — Z3A36 36 weeks gestation of pregnancy: Secondary | ICD-10-CM

## 2021-01-21 DIAGNOSIS — O471 False labor at or after 37 completed weeks of gestation: Secondary | ICD-10-CM | POA: Insufficient documentation

## 2021-01-21 LAB — CBC
HCT: 38.7 % (ref 36.0–46.0)
Hemoglobin: 12.7 g/dL (ref 12.0–15.0)
MCH: 29.7 pg (ref 26.0–34.0)
MCHC: 32.8 g/dL (ref 30.0–36.0)
MCV: 90.6 fL (ref 80.0–100.0)
Platelets: 335 10*3/uL (ref 150–400)
RBC: 4.27 MIL/uL (ref 3.87–5.11)
RDW: 14.6 % (ref 11.5–15.5)
WBC: 10.3 10*3/uL (ref 4.0–10.5)
nRBC: 0 % (ref 0.0–0.2)

## 2021-01-21 LAB — WET PREP, GENITAL
Clue Cells Wet Prep HPF POC: NONE SEEN
Sperm: NONE SEEN
Trich, Wet Prep: NONE SEEN
Yeast Wet Prep HPF POC: NONE SEEN

## 2021-01-21 LAB — TYPE AND SCREEN
ABO/RH(D): O POS
Antibody Screen: NEGATIVE

## 2021-01-21 LAB — SARS CORONAVIRUS 2 BY RT PCR (HOSPITAL ORDER, PERFORMED IN ~~LOC~~ HOSPITAL LAB): SARS Coronavirus 2: NEGATIVE

## 2021-01-21 MED ORDER — BETAMETHASONE SOD PHOS & ACET 6 (3-3) MG/ML IJ SUSP
12.0000 mg | Freq: Once | INTRAMUSCULAR | Status: AC
  Administered 2021-01-21: 12 mg via INTRAMUSCULAR
  Filled 2021-01-21: qty 5

## 2021-01-21 MED ORDER — BETAMETHASONE SOD PHOS & ACET 6 (3-3) MG/ML IJ SUSP
12.0000 mg | Freq: Once | INTRAMUSCULAR | Status: AC
  Administered 2021-01-22: 12 mg via INTRAMUSCULAR

## 2021-01-21 MED ORDER — LACTATED RINGERS IV SOLN: INTRAVENOUS | Status: DC

## 2021-01-21 NOTE — MAU Provider Note (Signed)
Asked by nurse to check patient. Contracting since last night, increasing this morning.  Dilation: 3.5 Effacement (%): 60 Station: -2 Exam by:: Dr. Purcell Mouton, DO 01/21/2021 11:05 AM

## 2021-01-21 NOTE — MAU Note (Signed)
Patient reports to MAU after being seen here earlier today.  Reports ctx that are closer and stronger.  +FM. Denies leaking of fluid or bleeding

## 2021-01-21 NOTE — H&P (Addendum)
OB ADMISSION/ HISTORY & PHYSICAL:  Admission Date: 01/21/2021  8:52 PM  Admit Diagnosis: Preterm labor  Sarah Quinn is a 25 y.o. female [phone] [redacted]w[redacted]d presenting for ctx.Cherlynn Perches active FM, denies LOF and vaginal bleeding. Ctx began this morning and pt was treated and discharged from MAU @ 3 cm. Returned this evening w/ c/o ctx and was 5 cm w/ BBOW. Denies complications w/ this preg, takes no meds, Hx of SVD x2 uncomplicated. Most recent prenatal record is from June '21, but pt states she receives care at [facility] w/ Dr. Connye Burkitt and was last seen 1 week ago.   Significant prenatal events:  Patient Active Problem List   Diagnosis Date Noted  . SCOLIOSIS 08/31/2007    Prenatal Labs: ABO, Rh: --/--/O POS (01/24 2210) Antibody: NEG (01/24 2210) Rubella:    RPR:    HBsAg:    HIV:    GTT:  GBS:    GC/CHL:  Genetics:  Tdap/influenza vaccines:    OB History  Gravida Para Term Preterm AB Living  4 2 2  0 1 2  SAB IAB Ectopic Multiple Live Births  1 0 0 0 2    # Outcome Date GA Lbr Len/2nd Weight Sex Delivery Anes PTL Lv  4 Current           3 Term 08/21/19 [redacted]w[redacted]d 02:00 / 00:22 2571 g F Vag-Spont EPI  LIV  2 Term 05/05/10    M Vag-Spont   LIV  1 SAB             Medical / Surgical History: Past medical history:  Past Medical History:  Diagnosis Date  . Hx of chlamydia infection   . Hx of gonorrhea   . Syncope     Past surgical history:  Past Surgical History:  Procedure Laterality Date  . NO PAST SURGERIES     Family History:  Family History  Problem Relation Age of Onset  . Healthy Mother   . Healthy Father     Social History:  reports that she has never smoked. She has never used smokeless tobacco. She reports previous drug use. Drug: Marijuana. She reports that she does not drink alcohol.  Allergies: Patient has no known allergies.   Current Medications at time of admission:  Prior to Admission medications   Medication Sig Start Date End Date Taking?  Authorizing Provider  cyclobenzaprine (FLEXERIL) 10 MG tablet Take 1 tablet (10 mg total) by mouth 2 (two) times daily as needed for muscle spasms. 12/03/20   14/6/21, CNM  Prenatal Vit-Fe Fumarate-FA (PRENATAL COMPLETE) 14-0.4 MG TABS Take 2 tablets by mouth daily. 12/24/18   Muthersbaugh, 12/26/18, PA-C    Review of Systems: Constitutional: Negative   HENT: Negative   Eyes: Negative   Respiratory: Negative   Cardiovascular: Negative   Gastrointestinal: Negative  Genitourinary: pos for bloody show, neg for LOF   Musculoskeletal: Negative   Skin: Negative   Neurological: Negative   Endo/Heme/Allergies: Negative   Psychiatric/Behavioral: Negative    Physical Exam: VS: Blood pressure 127/88, pulse 93, last menstrual period 05/02/2020, SpO2 98 %, unknown if currently breastfeeding. AAO x3, no signs of distress Cardiovascular: RRR Respiratory: Lung fields clear to ausculation GU/GI: Abdomen gravid, non-tender, non-distended, active FM Extremities: no edema, negative for pain, tenderness, and cords  Cervical exam:Dilation: 5 Effacement (%): 80 Station: -2 Exam by:: J. Rasch NP FHR: baseline rate 155 / variability moderate / accelerations present / absent decelerations TOCO: 2-5 min  Prenatal Transfer Tool  Maternal Diabetes: No Genetic Screening: Normal Maternal Ultrasounds/Referrals: Normal Fetal Ultrasounds or other Referrals:  None Maternal Substance Abuse:  Yes:  Type: Marijuana Significant Maternal Medications:  None Significant Maternal Lab Results: Other: GBS pending    Assessment: 25 y.o. G9E0100 [redacted]w[redacted]d Late preterm labor  FHR category 1 GBS pending Pain management plan: Epidural   Plan:  Admit to L&D Expectant management Routine admission orders Epidural PRN GBS by PCR BMZ #2 in 12 hrs (0930) UDS GC/Chlam Wet prep Get prenatal labs from [facility] in AM.   Care plan coordinated w/ Dr. Su Hilt.   Roma Schanz MSN, CNM 01/21/2021 10:42  PM

## 2021-01-21 NOTE — MAU Provider Note (Signed)
S: Ms. Sarah Quinn is a 25 y.o. [phone] at [redacted]w[redacted]d  who presents to MAU today complaining contractions q 4-5 minutes since this morning.  She denies vaginal bleeding. She endorses/mucoid discharge . She reports normal fetal movement.    O: BP 127/88   Pulse 93   LMP 05/02/2020 (Exact Date)   SpO2 98%  GENERAL: Well-developed, well-nourished female in no acute distress.  HEAD: Normocephalic, atraumatic.  CHEST: Normal effort of breathing, regular heart rate ABDOMEN: Soft, nontender, gravid  Cervical exam:  Dilation: 4.5/5 Effacement (%): 80 Station: -2 Presentation:  (Bulging bag) Exam by:: J. Gurleen Larrivee NP   Fetal Monitoring: Baseline: 150 bpm Variability: Moderate  Accelerations: 15x15 Decelerations: None Contractions: Q 5 mins apart.    A: SIUP at [redacted]w[redacted]d  Active labor  P:  Recommened admission RN to call CNM on-call BMZ given dose 1 in MAU @ 2130  Betty Daidone, Harolyn Rutherford, NP 01/21/2021 9:26 PM

## 2021-01-21 NOTE — MAU Note (Signed)
Pt reports contractions since last pm, worsening. Denies bleeding or ROM. Reports good fetal movement.

## 2021-01-22 LAB — RAPID URINE DRUG SCREEN, HOSP PERFORMED
Amphetamines: NOT DETECTED
Barbiturates: NOT DETECTED
Benzodiazepines: NOT DETECTED
Cocaine: NOT DETECTED
Opiates: NOT DETECTED
Tetrahydrocannabinol: NOT DETECTED

## 2021-01-22 LAB — GC/CHLAMYDIA PROBE AMP (~~LOC~~) NOT AT ARMC
Chlamydia: NEGATIVE
Comment: NEGATIVE
Comment: NORMAL
Neisseria Gonorrhea: NEGATIVE

## 2021-01-22 LAB — GROUP B STREP BY PCR: Group B strep by PCR: NEGATIVE

## 2021-01-22 LAB — RPR: RPR Ser Ql: NONREACTIVE

## 2021-01-22 MED ORDER — SOD CITRATE-CITRIC ACID 500-334 MG/5ML PO SOLN: 30.0000 mL | ORAL | Status: DC | PRN

## 2021-01-22 MED ORDER — OXYCODONE-ACETAMINOPHEN 5-325 MG PO TABS: 2.0000 | ORAL_TABLET | ORAL | Status: DC | PRN

## 2021-01-22 MED ORDER — ONDANSETRON HCL 4 MG/2ML IJ SOLN: 4.0000 mg | Freq: Four times a day (QID) | INTRAMUSCULAR | Status: DC | PRN

## 2021-01-22 MED ORDER — MORPHINE SULFATE (PF) 10 MG/ML IV SOLN
10.0000 mg | Freq: Once | INTRAVENOUS | Status: DC
  Filled 2021-01-22: qty 1

## 2021-01-22 MED ORDER — ACETAMINOPHEN 325 MG PO TABS: 650.0000 mg | ORAL_TABLET | ORAL | Status: DC | PRN

## 2021-01-22 MED ORDER — HYDROXYZINE HCL 50 MG/ML IM SOLN
50.0000 mg | Freq: Once | INTRAMUSCULAR | Status: DC
  Administered 2021-01-22: 50 mg via INTRAMUSCULAR
  Filled 2021-01-22: qty 1

## 2021-01-22 MED ORDER — LACTATED RINGERS IV SOLN: 500.0000 mL | INTRAVENOUS | Status: DC | PRN

## 2021-01-22 NOTE — Progress Notes (Signed)
OB Progress Note  S: Patient resting comfortably. She has been asleep.    O: BP 119/78 (BP Location: Left Arm)   Pulse 90   Temp 98.4 F (36.9 C) (Oral)   Resp 14   LMP 05/02/2020 (Exact Date)   SpO2 100%   FHT: 130bpm, moderate variablity, + accels, - decels Toco: None SVE: 3-4/80/-3, cervix posterior, sutures palpated  A/P: 25 y.o. [id number]  @ [redacted]w[redacted]d admitted for therapeutic rest for PTL.  PTL - Cervix unchanged from original exam yesterday - S/p Betamethasone x 1 dose at ~2200 on 1/24, will give second dose prior to discharge - Reassuring FHT - No contractions  - Reassurance and PTL precautions reviewed  Disposition:  Discharge home today.  Has prenatal f/u on Thursday of this week.  Steva Ready, DO [phone] (office)

## 2021-01-22 NOTE — Discharge Summary (Signed)
Physician Discharge Summary  Patient ID: Sarah Quinn MRN: [id number] DOB/AGE: February 25, 1996 24 y.o.  Admit date: 01/21/2021 Discharge date: 01/22/2021  Admission Diagnoses:   Preterm Labor  Discharge Diagnoses:  Active Problems:   Preterm labor   Discharged Condition: good  Hospital Course: Patient is a [id number] at [redacted]w[redacted]d admitted on 01/21/21 for preterm labor.  She was seen in MAU on 1/24 and was discharged home with her cervix at 4cm.  She returned later in the evening and had persistent contractions and cervix was called 5cm.  At midnight her cervical exam was 3/80/-2 and contractions had spaced out after receiving IV fluids. On 01/22/21, her cervix was 3-4/80/-3.  No contractions were noted and fetal heart tracing was Category I.  She received Betamethasone x 2 doses prior to discharge home.  Consults: None  Significant Diagnostic Studies: labs: CBC, RPR  Treatments: IV hydration and Vistaril  Discharge Exam: Blood pressure (!) 93/52, pulse 92, temperature 98 F (36.7 C), temperature source Oral, resp. rate 18, last menstrual period 05/02/2020, SpO2 100 %, unknown if currently breastfeeding. General appearance: alert, cooperative and appears stated age Resp: No increased work of breathing GI: soft, gravid, non-tender througout Pelvic: cervix 3-4/80/-3, sutures palpated Extremities: extremities normal, atraumatic, no cyanosis or edema Skin: Skin color, texture, turgor normal. No rashes or lesions  Disposition: Discharge disposition: 01-Home or Self Care        Allergies as of 01/22/2021   No Known Allergies     Medication List    TAKE these medications   cyclobenzaprine 10 MG tablet Commonly known as: FLEXERIL Take 1 tablet (10 mg total) by mouth 2 (two) times daily as needed for muscle spasms.   Prenatal Complete 14-0.4 MG Tabs Take 2 tablets by mouth daily.       Follow-up Information    Steva Ready, DO Follow up in 2 day(s).   Specialty: Obstetrics  and Gynecology Why: Please keep your next prenatal visit as previously scheduled. Contact information: [address] [place] [address] [phone]               Signed: Steva Ready 01/22/2021, 7:52 AM

## 2021-01-22 NOTE — Progress Notes (Signed)
Subjective:    Desires epidural for pain management, talking through contractions. SVE performed and no cervical change noted. Multip cervix. Informed pt that labor will not be induced or augmented due to preterm gestation and pt understands that she must labor spontaneously. Offered therapeutic rest versus epidural and will re-eval if pt becomes more uncomfortable.   Objective:    VS: BP 119/78 (BP Location: Left Arm)   Pulse 90   Temp 98.4 F (36.9 C) (Oral)   Resp 14   LMP 05/02/2020 (Exact Date)   SpO2 100%  FHR : baseline 155 / variability moderate / accelerations present / absent decelerations Toco: contractions irreg Membranes: intact Dilation: 3 Effacement (%): 80 Station: -2 Presentation:  vertex Exam by:: Rhea Pink CNM   Assessment/Plan:   25 y.o. [phone] [redacted]w[redacted]d Preterm labor Expectant management   Labor: Latent Preeclampsia:  no signs or symptoms of toxicity Fetal Wellbeing:  Category I Pain Control:  Therapeutic rest w/ Morphine 10 mg and Vistaril 50 mg IM I/D:  GBS pending   Sarah Schanz MSN, CNM 01/22/2021 12:02 AM

## 2021-02-06 ENCOUNTER — Inpatient Hospital Stay (HOSPITAL_COMMUNITY): Payer: Medicaid Other | Admitting: Anesthesiology

## 2021-02-06 ENCOUNTER — Other Ambulatory Visit: Payer: Self-pay

## 2021-02-06 ENCOUNTER — Inpatient Hospital Stay (HOSPITAL_COMMUNITY)
Admission: AD | Admit: 2021-02-06 | Discharge: 2021-02-07 | DRG: 807 | Disposition: A | Discharge status: Home / Self Care | Payer: Medicaid Other | Attending: Obstetrics and Gynecology | Admitting: Obstetrics and Gynecology

## 2021-02-06 ENCOUNTER — Encounter (HOSPITAL_COMMUNITY): Payer: Self-pay | Admitting: Obstetrics and Gynecology

## 2021-02-06 DIAGNOSIS — Z20822 Contact with and (suspected) exposure to covid-19: Secondary | ICD-10-CM | POA: Diagnosis present

## 2021-02-06 DIAGNOSIS — Z3A38 38 weeks gestation of pregnancy: Secondary | ICD-10-CM | POA: Diagnosis not present

## 2021-02-06 DIAGNOSIS — O26893 Other specified pregnancy related conditions, third trimester: Secondary | ICD-10-CM | POA: Diagnosis present

## 2021-02-06 LAB — CBC
HCT: 38 % (ref 36.0–46.0)
Hemoglobin: 12.6 g/dL (ref 12.0–15.0)
MCH: 29.9 pg (ref 26.0–34.0)
MCHC: 33.2 g/dL (ref 30.0–36.0)
MCV: 90 fL (ref 80.0–100.0)
Platelets: 246 10*3/uL (ref 150–400)
RBC: 4.22 MIL/uL (ref 3.87–5.11)
RDW: 14.7 % (ref 11.5–15.5)
WBC: 7.3 10*3/uL (ref 4.0–10.5)
nRBC: 0 % (ref 0.0–0.2)

## 2021-02-06 LAB — RESP PANEL BY RT-PCR (FLU A&B, COVID) ARPGX2
Influenza A by PCR: NEGATIVE
Influenza B by PCR: NEGATIVE
SARS Coronavirus 2 by RT PCR: NEGATIVE

## 2021-02-06 LAB — TYPE AND SCREEN
ABO/RH(D): O POS
Antibody Screen: NEGATIVE

## 2021-02-06 LAB — RAPID HIV SCREEN (HIV 1/2 AB+AG)
HIV 1/2 Antibodies: NONREACTIVE
HIV-1 P24 Antigen - HIV24: NONREACTIVE

## 2021-02-06 LAB — RPR: RPR Ser Ql: NONREACTIVE

## 2021-02-06 MED ORDER — COCONUT OIL OIL: 1.0000 "application " | TOPICAL_OIL | Status: DC | PRN

## 2021-02-06 MED ORDER — IBUPROFEN 800 MG PO TABS
800.0000 mg | ORAL_TABLET | Freq: Three times a day (TID) | ORAL | Status: DC
  Administered 2021-02-06 – 2021-02-07 (×4): 800 mg via ORAL
  Filled 2021-02-06 (×4): qty 1

## 2021-02-06 MED ORDER — ONDANSETRON HCL 4 MG/2ML IJ SOLN: 4.0000 mg | Freq: Four times a day (QID) | INTRAMUSCULAR | Status: DC | PRN

## 2021-02-06 MED ORDER — OXYCODONE-ACETAMINOPHEN 5-325 MG PO TABS: 1.0000 | ORAL_TABLET | ORAL | Status: DC | PRN

## 2021-02-06 MED ORDER — EPHEDRINE 5 MG/ML INJ: 10.0000 mg | INTRAVENOUS | Status: DC | PRN

## 2021-02-06 MED ORDER — PHENYLEPHRINE 40 MCG/ML (10ML) SYRINGE FOR IV PUSH (FOR BLOOD PRESSURE SUPPORT)
80.0000 ug | PREFILLED_SYRINGE | INTRAVENOUS | Status: DC | PRN
  Filled 2021-02-06: qty 10

## 2021-02-06 MED ORDER — SENNOSIDES-DOCUSATE SODIUM 8.6-50 MG PO TABS
2.0000 | ORAL_TABLET | Freq: Every day | ORAL | Status: DC
  Filled 2021-02-06: qty 2

## 2021-02-06 MED ORDER — FENTANYL-BUPIVACAINE-NACL 0.5-0.125-0.9 MG/250ML-% EP SOLN
12.0000 mL/h | EPIDURAL | Status: DC | PRN
  Filled 2021-02-06: qty 250

## 2021-02-06 MED ORDER — MISOPROSTOL 200 MCG PO TABS
ORAL_TABLET | ORAL | Status: AC
  Filled 2021-02-06: qty 5

## 2021-02-06 MED ORDER — SOD CITRATE-CITRIC ACID 500-334 MG/5ML PO SOLN: 30.0000 mL | ORAL | Status: DC | PRN

## 2021-02-06 MED ORDER — LACTATED RINGERS IV SOLN: 500.0000 mL | INTRAVENOUS | Status: DC | PRN

## 2021-02-06 MED ORDER — PHENYLEPHRINE 40 MCG/ML (10ML) SYRINGE FOR IV PUSH (FOR BLOOD PRESSURE SUPPORT): 80.0000 ug | PREFILLED_SYRINGE | INTRAVENOUS | Status: DC | PRN

## 2021-02-06 MED ORDER — BENZOCAINE-MENTHOL 20-0.5 % EX AERO: 1.0000 "application " | INHALATION_SPRAY | CUTANEOUS | Status: DC | PRN

## 2021-02-06 MED ORDER — DIPHENHYDRAMINE HCL 50 MG/ML IJ SOLN: 12.5000 mg | INTRAMUSCULAR | Status: DC | PRN

## 2021-02-06 MED ORDER — DIPHENHYDRAMINE HCL 25 MG PO CAPS: 25.0000 mg | ORAL_CAPSULE | Freq: Four times a day (QID) | ORAL | Status: DC | PRN

## 2021-02-06 MED ORDER — OXYTOCIN-SODIUM CHLORIDE 30-0.9 UT/500ML-% IV SOLN
2.5000 [IU]/h | INTRAVENOUS | Status: DC
  Filled 2021-02-06: qty 500

## 2021-02-06 MED ORDER — LIDOCAINE HCL (PF) 1 % IJ SOLN: 30.0000 mL | INTRAMUSCULAR | Status: DC | PRN

## 2021-02-06 MED ORDER — OXYCODONE-ACETAMINOPHEN 5-325 MG PO TABS: 2.0000 | ORAL_TABLET | ORAL | Status: DC | PRN

## 2021-02-06 MED ORDER — LACTATED RINGERS IV SOLN
500.0000 mL | Freq: Once | INTRAVENOUS | Status: AC
  Administered 2021-02-06: 500 mL via INTRAVENOUS

## 2021-02-06 MED ORDER — ONDANSETRON HCL 4 MG/2ML IJ SOLN: 4.0000 mg | INTRAMUSCULAR | Status: DC | PRN

## 2021-02-06 MED ORDER — ACETAMINOPHEN 325 MG PO TABS
650.0000 mg | ORAL_TABLET | ORAL | Status: DC | PRN
  Administered 2021-02-06 – 2021-02-07 (×3): 650 mg via ORAL
  Filled 2021-02-06 (×3): qty 2

## 2021-02-06 MED ORDER — DIBUCAINE (PERIANAL) 1 % EX OINT: 1.0000 "application " | TOPICAL_OINTMENT | CUTANEOUS | Status: DC | PRN

## 2021-02-06 MED ORDER — LACTATED RINGERS IV SOLN: INTRAVENOUS | Status: DC

## 2021-02-06 MED ORDER — SIMETHICONE 80 MG PO CHEW: 80.0000 mg | CHEWABLE_TABLET | ORAL | Status: DC | PRN

## 2021-02-06 MED ORDER — PRENATAL COMPLETE 14-0.4 MG PO TABS: 2.0000 | ORAL_TABLET | Freq: Every day | ORAL | Status: DC

## 2021-02-06 MED ORDER — TETANUS-DIPHTH-ACELL PERTUSSIS 5-2.5-18.5 LF-MCG/0.5 IM SUSY: 0.5000 mL | PREFILLED_SYRINGE | Freq: Once | INTRAMUSCULAR | Status: DC

## 2021-02-06 MED ORDER — TRANEXAMIC ACID-NACL 1000-0.7 MG/100ML-% IV SOLN: 1000.0000 mg | INTRAVENOUS | Status: DC

## 2021-02-06 MED ORDER — FENTANYL-BUPIVACAINE-NACL 0.5-0.125-0.9 MG/250ML-% EP SOLN
EPIDURAL | Status: DC | PRN
  Administered 2021-02-06: 12 mL/h via EPIDURAL

## 2021-02-06 MED ORDER — ONDANSETRON HCL 4 MG PO TABS: 4.0000 mg | ORAL_TABLET | ORAL | Status: DC | PRN

## 2021-02-06 MED ORDER — ZOLPIDEM TARTRATE 5 MG PO TABS: 5.0000 mg | ORAL_TABLET | Freq: Every evening | ORAL | Status: DC | PRN

## 2021-02-06 MED ORDER — LIDOCAINE HCL (PF) 1 % IJ SOLN
INTRAMUSCULAR | Status: DC | PRN
  Administered 2021-02-06: 5 mL via EPIDURAL

## 2021-02-06 MED ORDER — TRANEXAMIC ACID-NACL 1000-0.7 MG/100ML-% IV SOLN
INTRAVENOUS | Status: AC
  Administered 2021-02-06: 1000 mg
  Filled 2021-02-06: qty 100

## 2021-02-06 MED ORDER — MISOPROSTOL 200 MCG PO TABS
1000.0000 ug | ORAL_TABLET | Freq: Once | ORAL | Status: AC
  Administered 2021-02-06: 1000 ug via RECTAL

## 2021-02-06 MED ORDER — ACETAMINOPHEN 325 MG PO TABS: 650.0000 mg | ORAL_TABLET | ORAL | Status: DC | PRN

## 2021-02-06 MED ORDER — OXYTOCIN BOLUS FROM INFUSION
333.0000 mL | Freq: Once | INTRAVENOUS | Status: AC
  Administered 2021-02-06: 333 mL via INTRAVENOUS

## 2021-02-06 MED ORDER — PRENATAL MULTIVITAMIN CH
1.0000 | ORAL_TABLET | Freq: Every day | ORAL | Status: DC
  Administered 2021-02-07: 1 via ORAL
  Filled 2021-02-06 (×2): qty 1

## 2021-02-06 MED ORDER — WITCH HAZEL-GLYCERIN EX PADS: 1.0000 "application " | MEDICATED_PAD | CUTANEOUS | Status: DC | PRN

## 2021-02-06 NOTE — Anesthesia Preprocedure Evaluation (Addendum)
Anesthesia Evaluation  Patient identified by MRN, date of birth, ID band Patient awake    Reviewed: Allergy & Precautions, NPO status , Patient's Chart, lab work & pertinent test results  Airway Mallampati: II  TM Distance: >3 FB Neck ROM: Full    Dental no notable dental hx. (+) Teeth Intact, Dental Advisory Given   Pulmonary neg pulmonary ROS,    Pulmonary exam normal breath sounds clear to auscultation       Cardiovascular negative cardio ROS Normal cardiovascular exam Rhythm:Regular Rate:Normal     Neuro/Psych negative neurological ROS     GI/Hepatic negative GI ROS, Neg liver ROS,   Endo/Other  negative endocrine ROS  Renal/GU negative Renal ROS     Musculoskeletal scoliosis   Abdominal   Peds  Hematology Lab Results      Component                Value               Date                      WBC                      7.3                 02/06/2021                HGB                      12.6                02/06/2021                HCT                      38.0                02/06/2021                MCV                      90.0                02/06/2021                PLT                      246                 02/06/2021              Anesthesia Other Findings   Reproductive/Obstetrics (+) Pregnancy                             Anesthesia Physical Anesthesia Plan  ASA: II  Anesthesia Plan: Epidural   Post-op Pain Management:    Induction:   PONV Risk Score and Plan:   Airway Management Planned:   Additional Equipment:   Intra-op Plan:   Post-operative Plan:   Informed Consent:   Plan Discussed with:   Anesthesia Plan Comments: (38.6 wk G4P2 for LEA )        Anesthesia Quick Evaluation

## 2021-02-06 NOTE — Anesthesia Procedure Notes (Signed)
Epidural Patient location during procedure: OB Start time: 02/06/2021 6:15 AM End time: 02/06/2021 6:25 AM  Staffing Anesthesiologist: Trevor Iha, MD Performed: anesthesiologist   Preanesthetic Checklist Completed: patient identified, IV checked, site marked, risks and benefits discussed, surgical consent, monitors and equipment checked, pre-op evaluation and timeout performed  Epidural Patient position: sitting Prep: DuraPrep and site prepped and draped Patient monitoring: continuous pulse ox and blood pressure Approach: midline Location: L3-L4 Injection technique: LOR air  Needle:  Needle type: Tuohy  Needle gauge: 17 G Needle length: 9 cm and 9 Needle insertion depth: 6 cm Catheter type: closed end flexible Catheter size: 19 Gauge Catheter at skin depth: 12 cm Test dose: negative  Assessment Events: blood not aspirated, injection not painful, no injection resistance, no paresthesia and negative IV test  Additional Notes Patient identified. Risks/Benefits/Options discussed with patient including but not limited to bleeding, infection, nerve damage, paralysis, failed block, incomplete pain control, headache, blood pressure changes, nausea, vomiting, reactions to medication both or allergic, itching and postpartum back pain. Confirmed with bedside nurse the patient's most recent platelet count. Confirmed with patient that they are not currently taking any anticoagulation, have any bleeding history or any family history of bleeding disorders. Patient expressed understanding and wished to proceed. All questions were answered. Sterile technique was used throughout the entire procedure. Please see nursing notes for vital signs. Test dose was given through epidural needle and negative prior to continuing to dose epidural or start infusion. Warning signs of high block given to the patient including shortness of breath, tingling/numbness in hands, complete motor block, or any concerning  symptoms with instructions to call for help. Patient was given instructions on fall risk and not to get out of bed. All questions and concerns addressed with instructions to call with any issues. 1 Attempt (S) . Patient tolerated procedure well.

## 2021-02-06 NOTE — Anesthesia Postprocedure Evaluation (Signed)
Anesthesia Post Note  Patient: Orbie Pyo  Procedure(s) Performed: AN AD HOC LABOR EPIDURAL     Patient location during evaluation: Mother Baby Anesthesia Type: Epidural Level of consciousness: awake and alert Pain management: pain level controlled Vital Signs Assessment: post-procedure vital signs reviewed and stable Respiratory status: spontaneous breathing, nonlabored ventilation and respiratory function stable Cardiovascular status: stable Postop Assessment: no headache, no backache and epidural receding Anesthetic complications: no   No complications documented.  Last Vitals:  Vitals:   02/06/21 1118 02/06/21 1515  BP: 117/65 117/62  Pulse: 82 78  Resp: 18 18  Temp: 36.7 C 37.2 C  SpO2: 98% 100%    Last Pain:  Vitals:   02/06/21 1515  TempSrc: Oral  PainSc: 5    Pain Goal:                   Andora Krull

## 2021-02-06 NOTE — MAU Note (Signed)
...  Sarah Quinn is a 25 y.o. at [redacted]w[redacted]d here in MAU via EMS reporting: CTX that began around 0330 and are 2-3 minutes apart. +FM. No LOF or VB. GBS-.

## 2021-02-06 NOTE — H&P (Signed)
HPI: 25 y.o. [id number] @ [redacted]w[redacted]d estimated gestational age (as dated by 6 week ultrasound) presents for contractions.  Patient ready for delivery upon my arrival to room.  Leakage of fluid:  No Vaginal bleeding:  No Contractions:  Yes Fetal movement:  Yes  Prenatal care has been provided by Dr. Steva Ready [facility]).  ROS:  Denies fevers, chills, chest pain, visual changes, SOB, RUQ/epigastric pain, N/V, dysuria, hematuria, or sudden onset/worsening bilateral LE or facial edema.  Pregnancy complicated by: None   Prenatal Transfer Tool  Maternal Diabetes: No Genetic Screening: Normal Maternal Ultrasounds/Referrals: Normal Fetal Ultrasounds or other Referrals:  None Maternal Substance Abuse:  No Significant Maternal Medications:  None Significant Maternal Lab Results: Group B Strep negative   Prenatal Labs Blood type:  O Positive Antibody screen:  Negative CBC:  H/H 12/ Rubella: Immune RPR:  Non-reactive Hep B:  Negative Hep C:  Negative GC/CT:  Negative Glucola:  Normal  Immunizations: Tdap: Given prenatally Flu: Declined  OBHx:  OB History    Gravida  4   Para  2   Term  2   Preterm  0   AB  1   Living  2     SAB  1   IAB  0   Ectopic  0   Multiple  0   Live Births  2          PMHx:  None Meds:  PNV Allergy:  No Known Allergies SurgHx: None SocHx:   Denies Tobacco, ETOH, illicit drugs  O: BP 119/87   Pulse 84   Temp 98.7 F (37.1 C) (Oral)   Resp 16   Ht 5\' 6"  (1.676 m)   Wt 85.3 kg   LMP 05/02/2020 (Exact Date)   SpO2 99%   BMI 30.35 kg/m  Gen. AAOx3, NAD CV.  RRR  Resp. CTAB, no wheezes/rales/rhonchi Abd. Gravid, soft, non-tender throughout, no rebound/guarding Extr.  no bilateral LE edema, no calf tenderness bilaterally SVE: complete +1    FHT: 140 baseline, moderate variability, - accels,  - decels Toco: q 1-2 min, not graphing well   Labs: see orders  A/P:  25 y.o. 30 @ [redacted]w[redacted]d who presents for labor.  -  Admit to L&D - Admit labs (CBC, T&S, COVID screen) - CEFM/Toco - FWB:  Category I - Diet:  Clear liquids - IVF:  LR at 125cc/hour - VTE Prophylaxis:  SCDs - GBS Status:  Negative - Presentation:  Cephalic - S/p SVD upon my arrival to room - see delivery note  [redacted]w[redacted]d, DO [phone] (office)

## 2021-02-07 LAB — CBC
HCT: 31.6 % — ABNORMAL LOW (ref 36.0–46.0)
Hemoglobin: 10.9 g/dL — ABNORMAL LOW (ref 12.0–15.0)
MCH: 30.6 pg (ref 26.0–34.0)
MCHC: 34.5 g/dL (ref 30.0–36.0)
MCV: 88.8 fL (ref 80.0–100.0)
Platelets: 204 10*3/uL (ref 150–400)
RBC: 3.56 MIL/uL — ABNORMAL LOW (ref 3.87–5.11)
RDW: 14.5 % (ref 11.5–15.5)
WBC: 9.2 10*3/uL (ref 4.0–10.5)
nRBC: 0 % (ref 0.0–0.2)

## 2021-02-07 MED ORDER — IBUPROFEN 800 MG PO TABS: 800.0000 mg | ORAL_TABLET | Freq: Three times a day (TID) | ORAL | 0 refills | Status: DC

## 2021-02-07 NOTE — Discharge Summary (Signed)
Postpartum Discharge Summary  02/07/21     Patient Name: Sarah Quinn DOB: 02/25/1996 MRN: [id number]  Date of admission: 02/06/2021 Delivery date:02/06/2021  Delivering provider: Drema Dallas  Date of discharge: 02/07/2021  Admitting diagnosis: Normal labor and delivery [O80] Intrauterine pregnancy: [redacted]w[redacted]d     Secondary diagnosis:  Active Problems:   Normal labor and delivery  Additional problems: None    Discharge diagnosis: Term Pregnancy Delivered                                              Post partum procedures:None Augmentation: None Complications: None  Hospital course: Onset of Labor With Vaginal Delivery      25 y.o. yo [id number] at [redacted]w[redacted]d was admitted in Active Labor on 02/06/2021. Patient had an uncomplicated labor course as follows:  Membrane Rupture Time/Date: 7:09 AM ,02/07/2021   Delivery Method:Vaginal, Spontaneous  Episiotomy: None  Lacerations:  None  Patient had an uncomplicated postpartum course.  She is ambulating, tolerating a regular diet, passing flatus, and urinating well. Unable to perform desired circumcision due to infant with penile torsion - will need outpatient Urology follow-up. Patient is discharged home in stable condition on 02/07/21.  Newborn Data: Birth date:02/06/2021  Birth time:7:44 AM  Gender:Female  Living status:Living  Apgars:9 ,9  Weight:3609 g   Magnesium Sulfate received: No BMZ received: No Rhophylac:No MMR:No T-DaP:Given prenatally Flu: No - Previously declined Transfusion:No  Physical exam  Vitals:   02/06/21 1515 02/06/21 1915 02/06/21 2310 02/07/21 0418  BP: 117/62 116/69 115/62 103/62  Pulse: 78 71 71 69  Resp: $Remo'18 17 18 18  'FYgBn$ Temp: 99 F (37.2 C) 98.6 F (37 C) 98.3 F (36.8 C) 98.1 F (36.7 C)  TempSrc: Oral Oral Oral Oral  SpO2: 100% 100% 99% 99%  Weight:      Height:       General: alert, cooperative and no distress Lochia: appropriate Uterine Fundus: firm Incision: N/A DVT Evaluation: No evidence of  DVT seen on physical exam. No cords or calf tenderness. Labs: Lab Results  Component Value Date   WBC 9.2 02/07/2021   HGB 10.9 (L) 02/07/2021   HCT 31.6 (L) 02/07/2021   MCV 88.8 02/07/2021   PLT 204 02/07/2021   CMP Latest Ref Rng & Units 12/23/2018  Glucose 70 - 99 mg/dL 84  BUN 6 - 20 mg/dL 7  Creatinine 0.44 - 1.00 mg/dL 1.03(H)  Sodium 135 - 145 mmol/L 137  Potassium 3.5 - 5.1 mmol/L 3.3(L)  Chloride 98 - 111 mmol/L 104  CO2 22 - 32 mmol/L 28  Calcium 8.9 - 10.3 mg/dL 9.1  Total Protein 6.5 - 8.1 g/dL 7.1  Total Bilirubin 0.3 - 1.2 mg/dL 0.8  Alkaline Phos 38 - 126 U/L 39  AST 15 - 41 U/L 19  ALT 0 - 44 U/L 17   Edinburgh Score: Edinburgh Postnatal Depression Scale Screening Tool 02/07/2021  I have been able to laugh and see the funny side of things. 0  I have looked forward with enjoyment to things. 0  I have blamed myself unnecessarily when things went wrong. 0  I have been anxious or worried for no good reason. 0  I have felt scared or panicky for no good reason. 0  Things have been getting on top of me. 0  I have been so unhappy that I have had difficulty sleeping.  0  I have felt sad or miserable. 0  I have been so unhappy that I have been crying. 0  The thought of harming myself has occurred to me. 0  Edinburgh Postnatal Depression Scale Total 0      After visit meds:  Allergies as of 02/07/2021   No Known Allergies     Medication List    TAKE these medications   cyclobenzaprine 10 MG tablet Commonly known as: FLEXERIL Take 1 tablet (10 mg total) by mouth 2 (two) times daily as needed for muscle spasms.   ibuprofen 800 MG tablet Commonly known as: ADVIL Take 1 tablet (800 mg total) by mouth every 8 (eight) hours.   Prenatal Complete 14-0.4 MG Tabs Take 2 tablets by mouth daily.        Discharge home in stable condition Infant Feeding: Bottle Infant Disposition:home with mother Discharge instruction: per After Visit Summary and Postpartum  booklet. Activity: Advance as tolerated. Pelvic rest for 6 weeks.  Diet: routine diet Anticipated Birth Control: None Postpartum Appointment:6 weeks Additional Postpartum F/U: None Future Appointments:No future appointments. Follow up Visit:  Follow-up Information    Drema Dallas, DO Follow up in 6 week(s).   Specialty: Obstetrics and Gynecology Why: 6 week postpartum visit Contact information: [address] [place] [address] [phone]                   02/07/2021 Drema Dallas, DO

## 2021-05-28 IMAGING — US US OB < 14 WEEKS - US OB TV
1 series · 15 of 28 positions shown · non-contrast
Comparison: Ultrasound 01/04/2019

CLINICAL DATA: Bleeding and cramping for 1 day

EXAM:
OBSTETRIC <14 WK US AND TRANSVAGINAL OB US
TECHNIQUE: Both transabdominal and transvaginal ultrasound examinations were
performed for complete evaluation of the gestation as well as the
maternal uterus, adnexal regions, and pelvic cul-de-sac.
Transvaginal technique was performed to assess early pregnancy.

[Series 1: us ob < 14 weeks - us ob tv · 40 acquisitions, 15 frames shown]
[im 1/40]
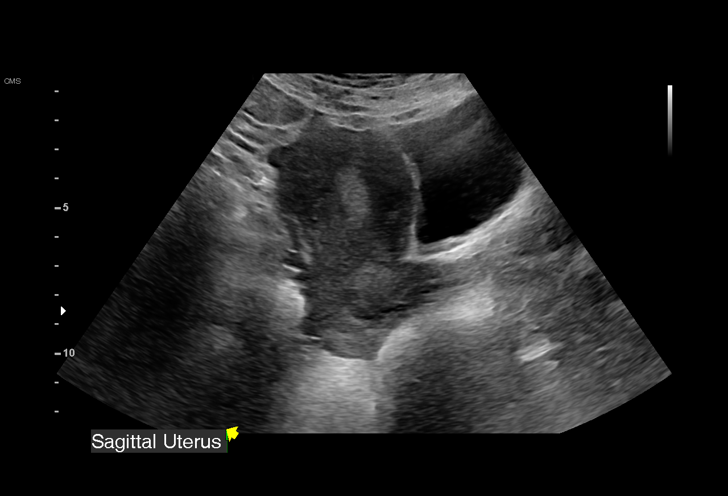
[im 3/40]
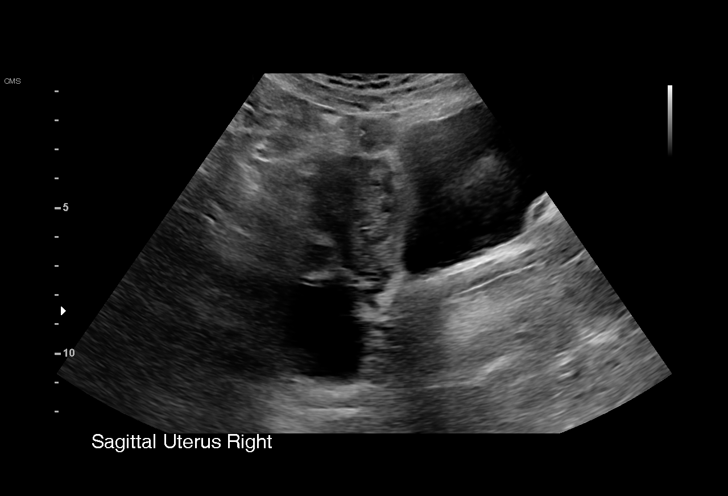
[im 6/40]
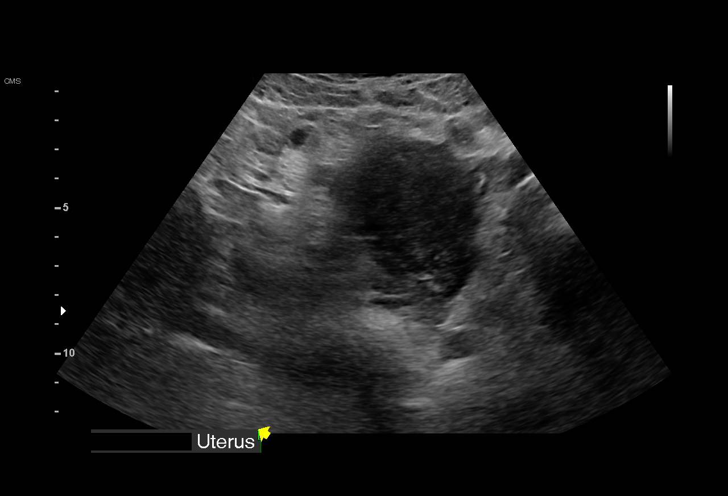
[im 9/40]
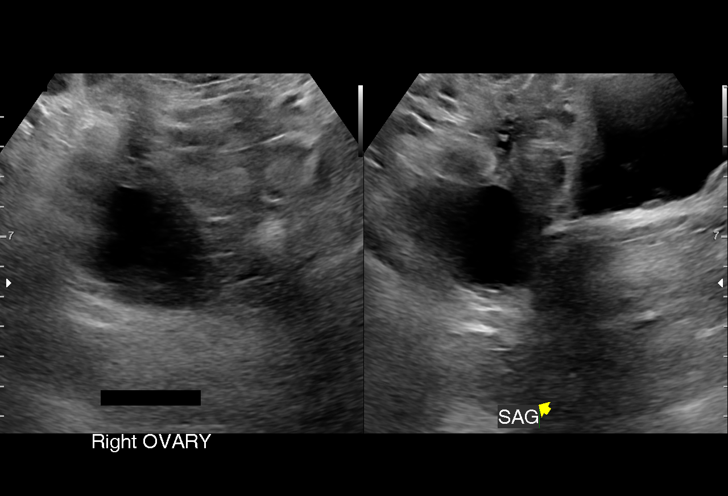
[im 12/40]
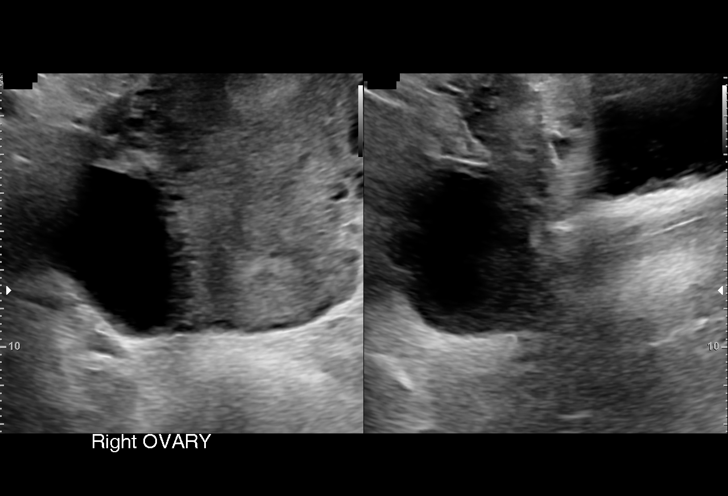
[im 15/40]
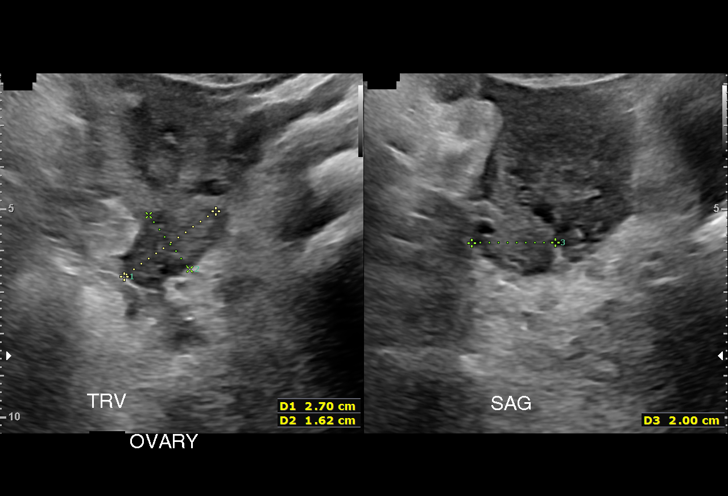
[im 18/40]
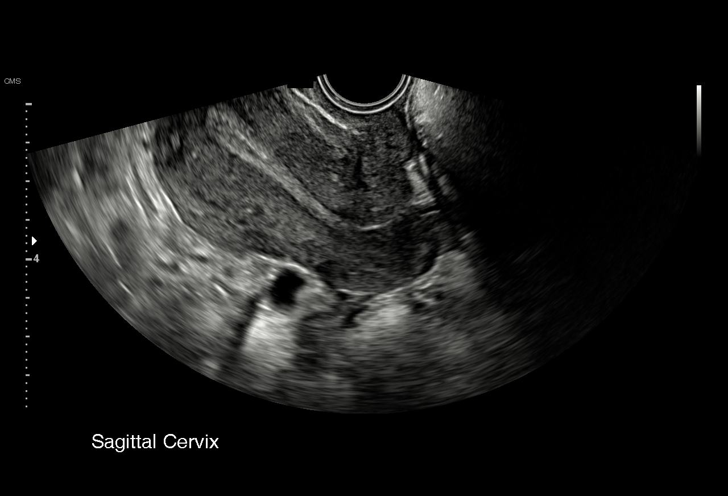
[im 21/40]
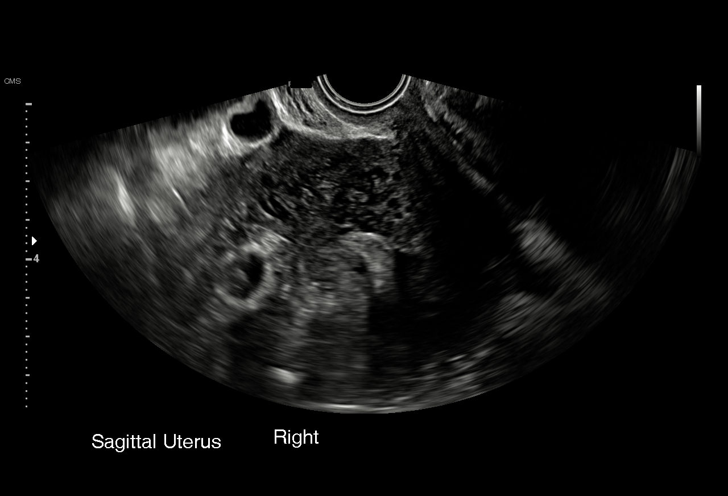
[im 22/40]
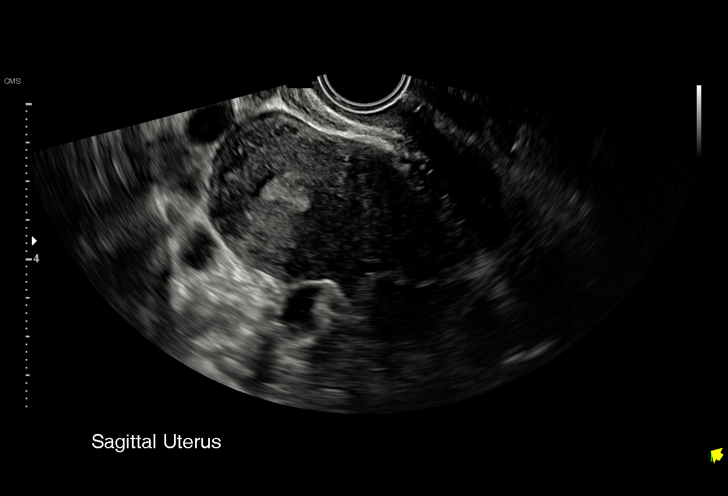
[im 25/40]
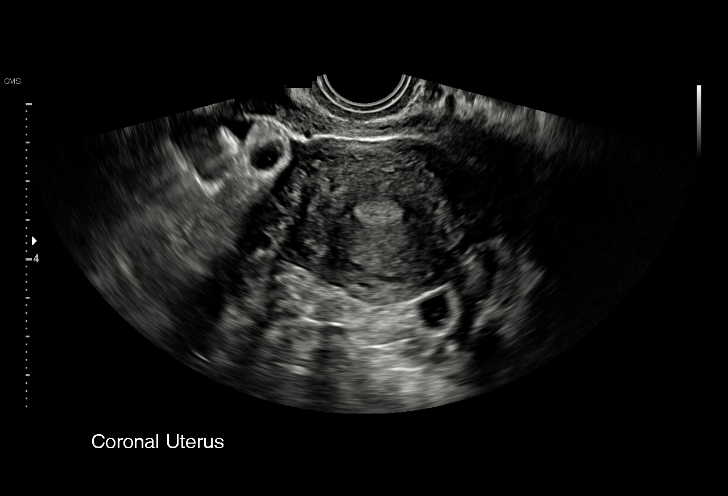
[im 28/40]
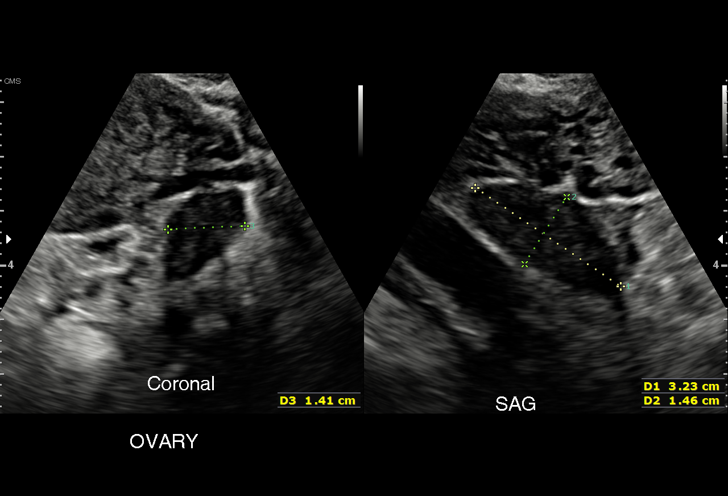
[im 31/40]
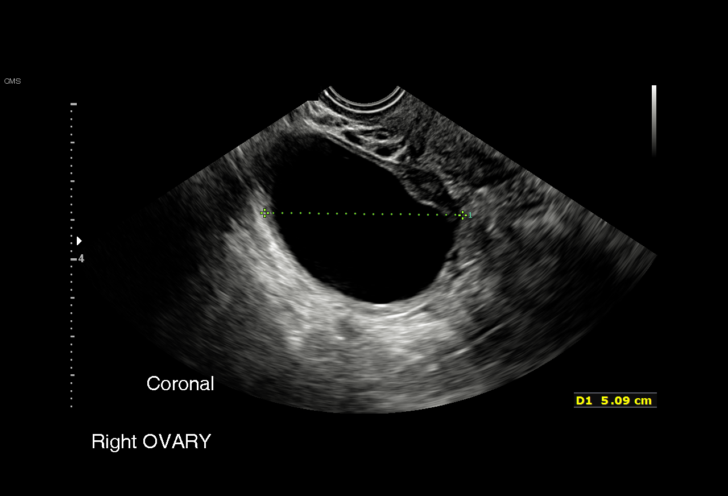
[im 34/40]
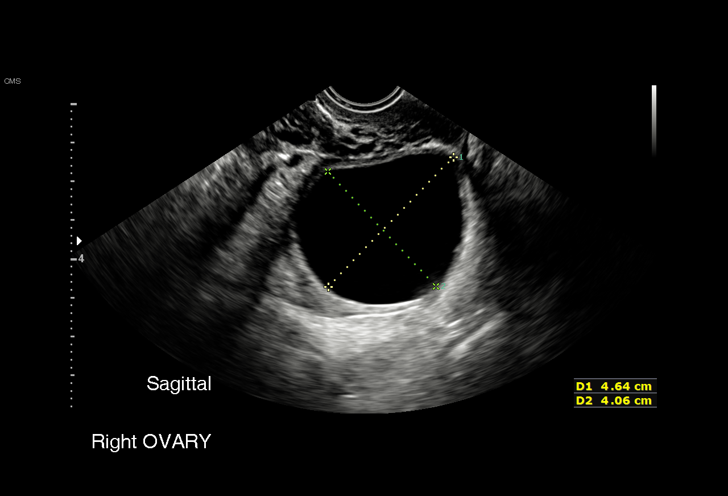
[im 37/40]
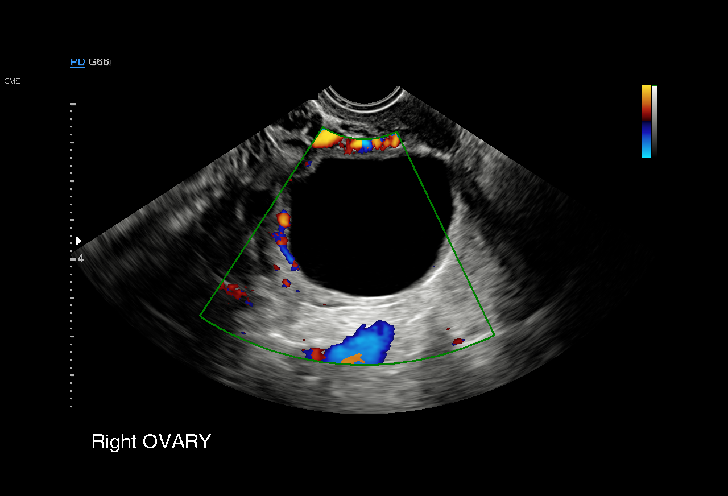
[im 40/40]
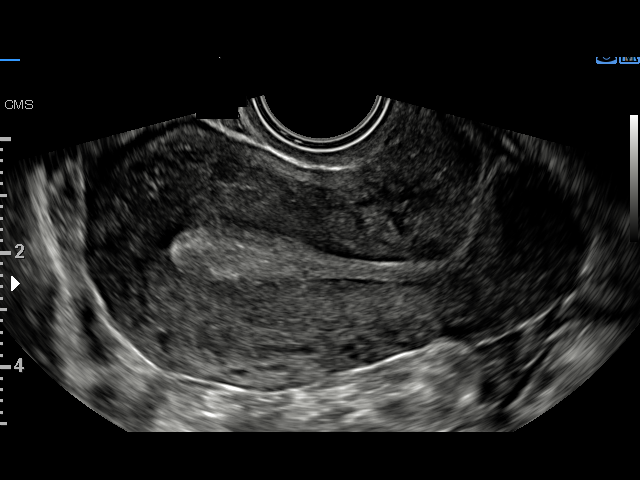

[15 of 28 positions shown; findings below may reference images not displayed]

FINDINGS: Intrauterine gestational sac: None

Yolk sac:  Not Visualized.

Embryo:  Not Visualized.

Cardiac Activity: Not Visualized.

Maternal uterus/adnexae: Normal appearance of the maternal uterus
and endometrium without significant endometrial thickening or
visible endometrial fluid collection. 4.6 cm simple appearing fluid
attenuation cyst in the right ovary, no further evaluation warranted
by size and imaging criteria. No visible ectopic gestation is seen.
No free fluid in the pelvis.
IMPRESSION: No intrauterine pregnancy visualized. Differential considerations
would include early intrauterine pregnancy too early to visualize,
spontaneous abortion, or occult ectopic pregnancy. Recommend close
clinical followup and serial quantitative beta HCGs and ultrasounds.

## 2021-07-28 IMAGING — US US OB TRANSVAGINAL
1 series · 15 of 28 positions shown · non-contrast
Comparison: Obstetric ultrasound 04/09/2020 with no intrauterine
pregnancy visualized at that time.

CLINICAL DATA: Pregnant patient in first-trimester pregnancy with
vaginal bleeding. Last menstrual period 05/02/2020. beta HCG 653.

EXAM:
TRANSVAGINAL OB ULTRASOUND
TECHNIQUE: Transvaginal ultrasound was performed for complete evaluation of the
gestation as well as the maternal uterus, adnexal regions, and
pelvic cul-de-sac.

[Series 1: us ob transvaginal · 15 of 33 slices shown]
[im 1/33]
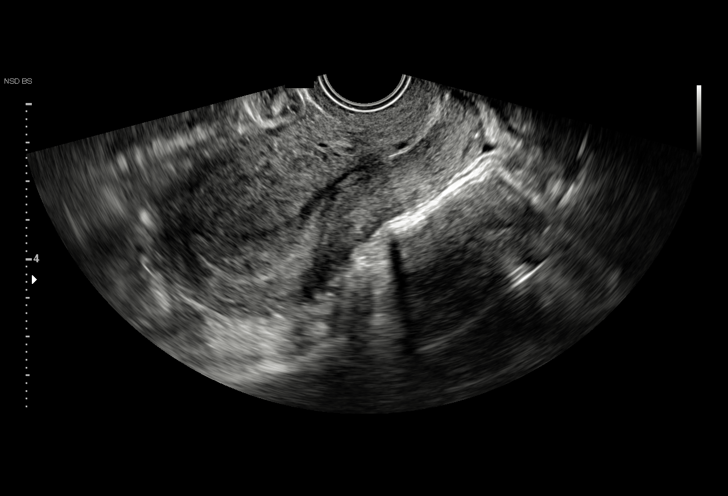
[im 3/33]
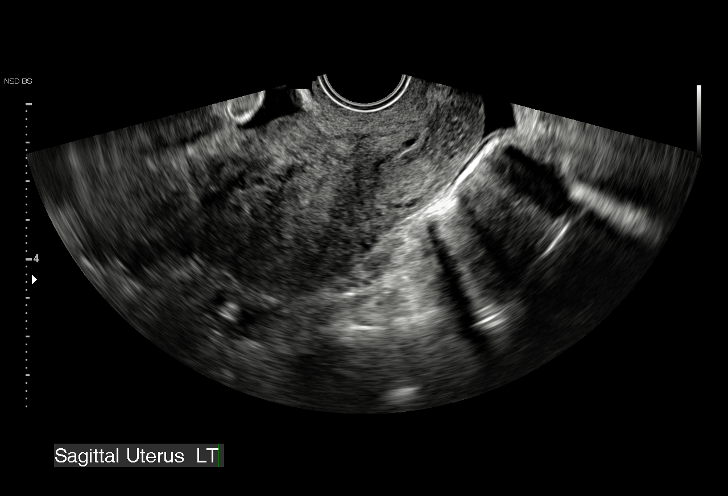
[im 5/33]
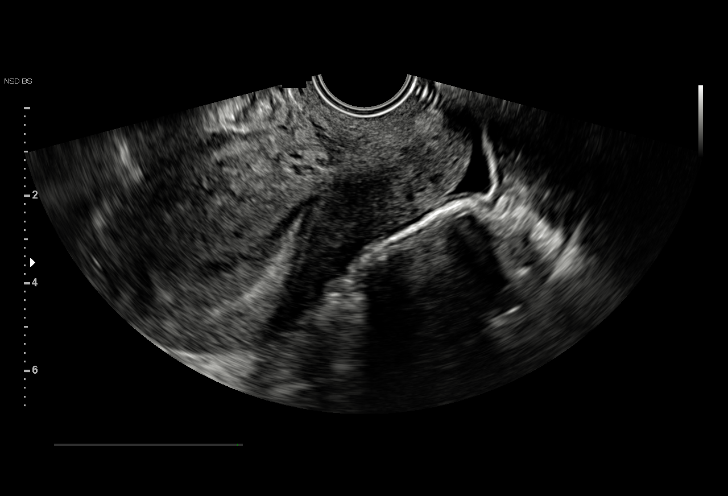
[im 8/33]
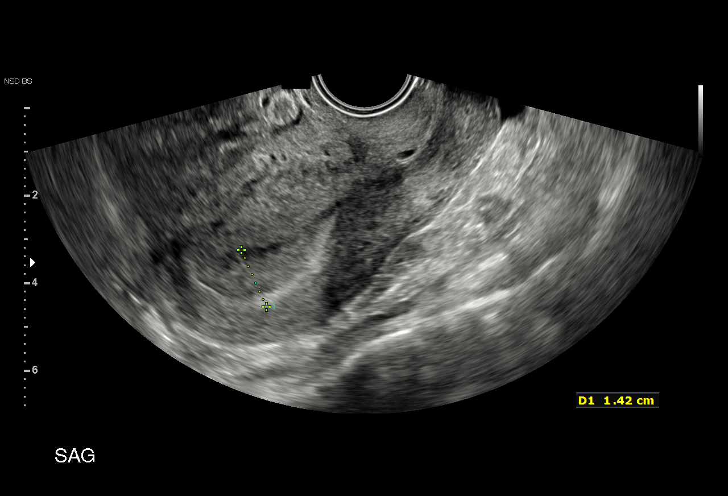
[im 10/33]
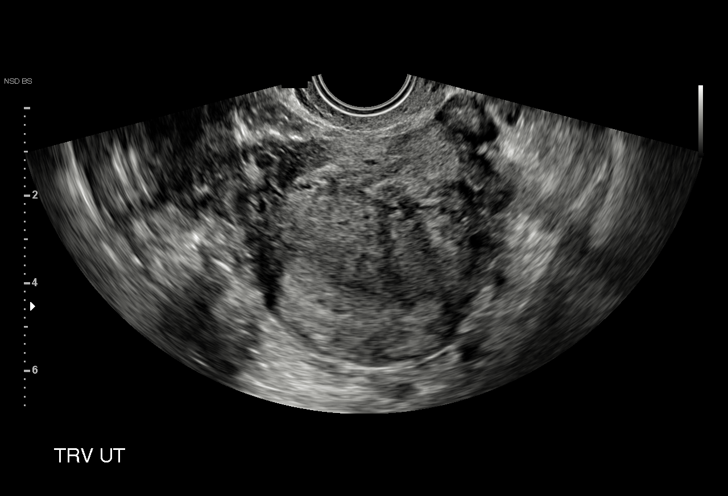
[im 12/33]
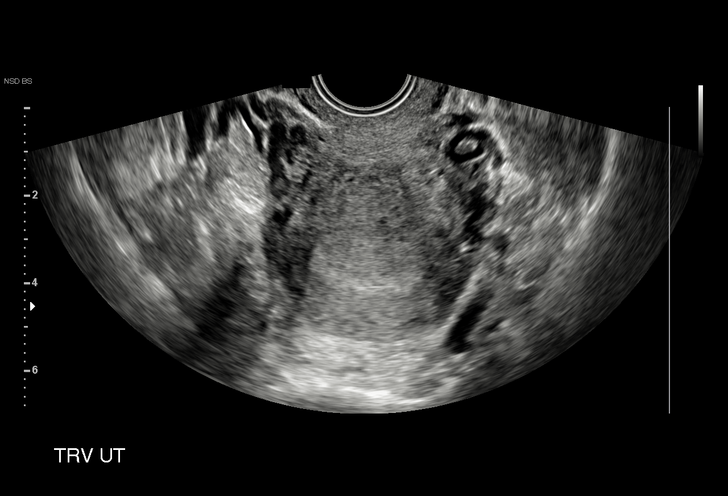
[im 15/33]
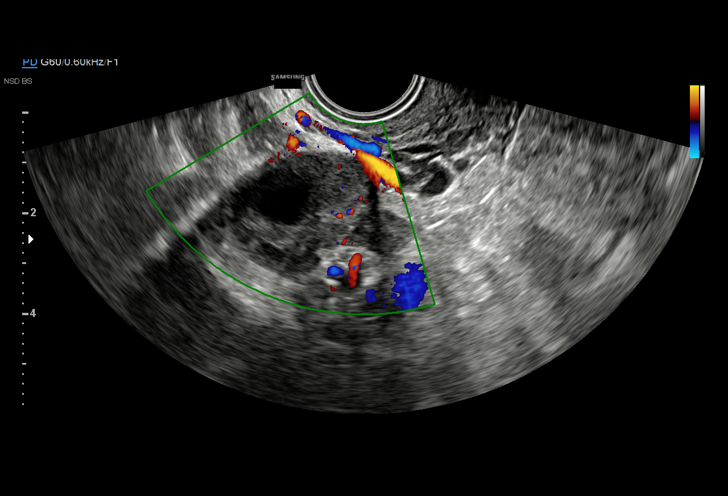
[im 17/33]
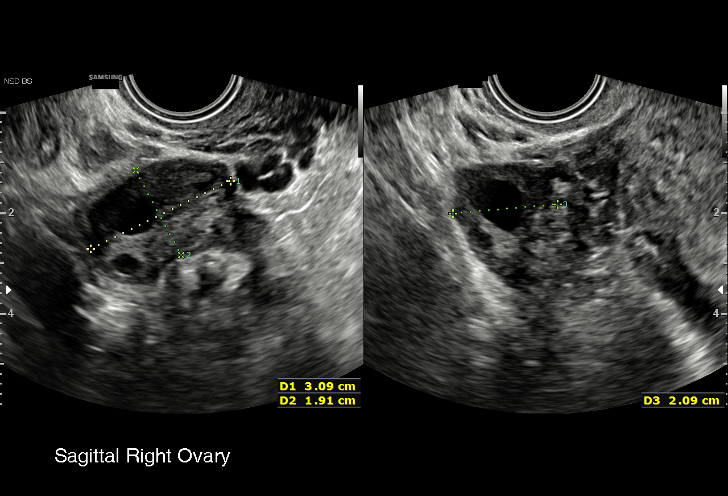
[im 18/33]
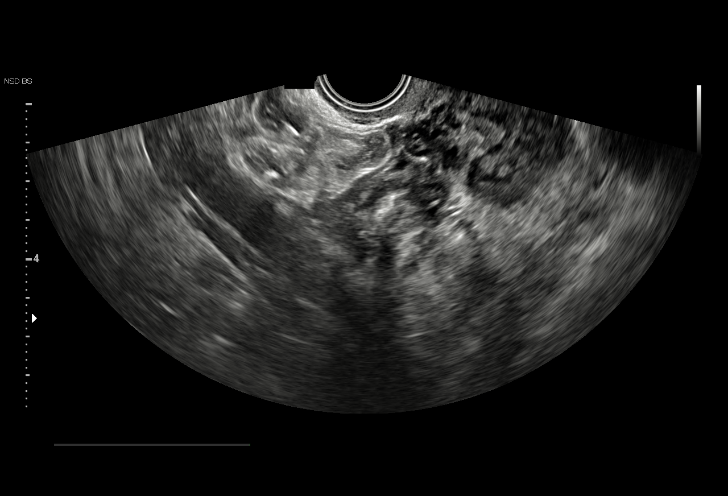
[im 21/33]
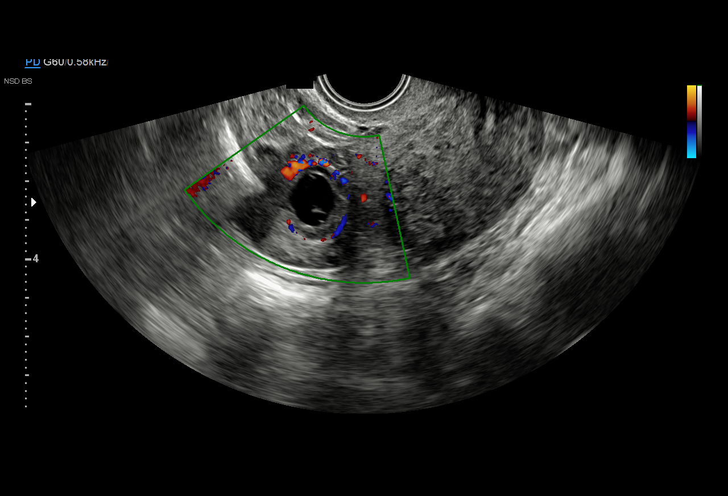
[im 23/33]
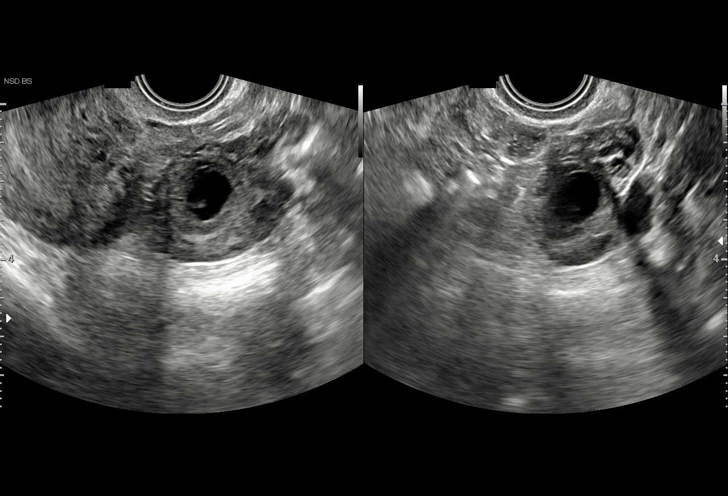
[im 25/33]
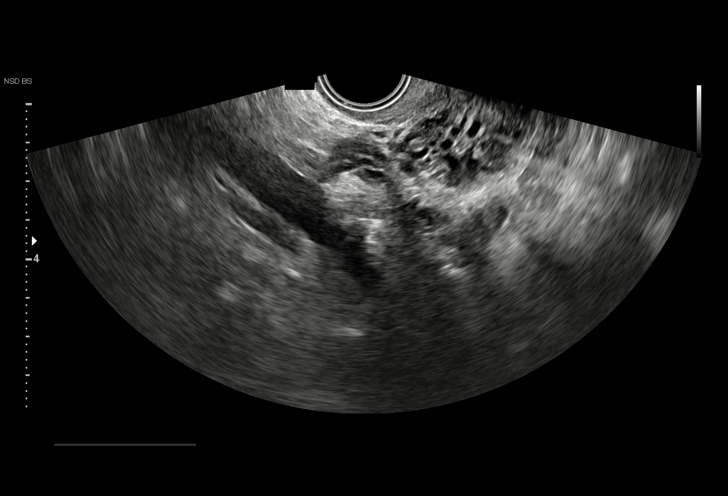
[im 28/33]
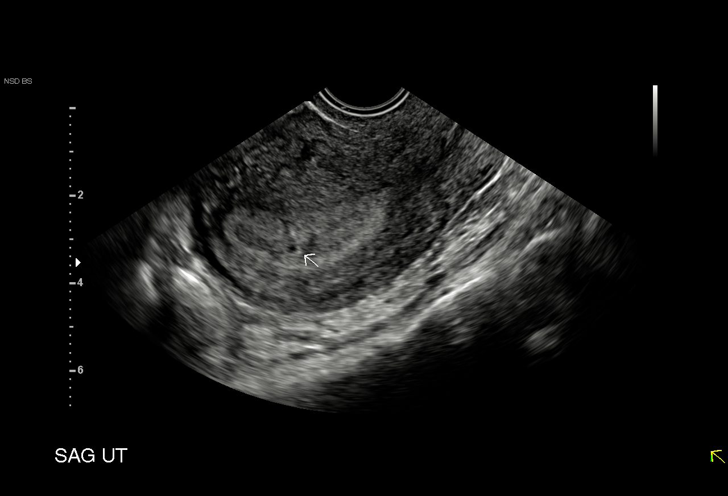
[im 30/33]
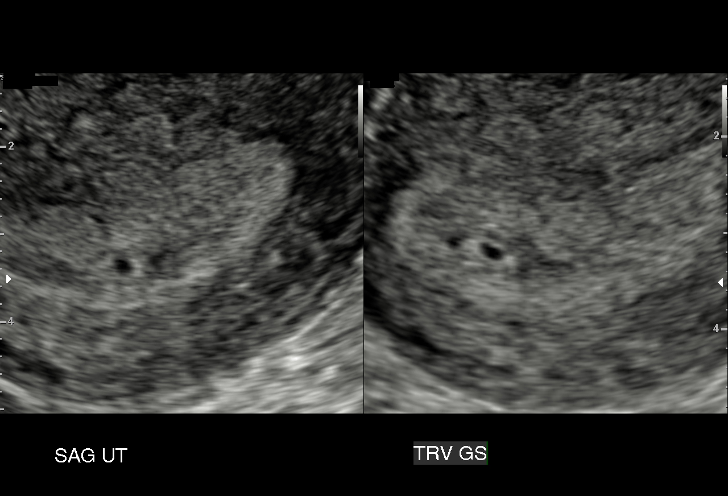
[im 33/33]
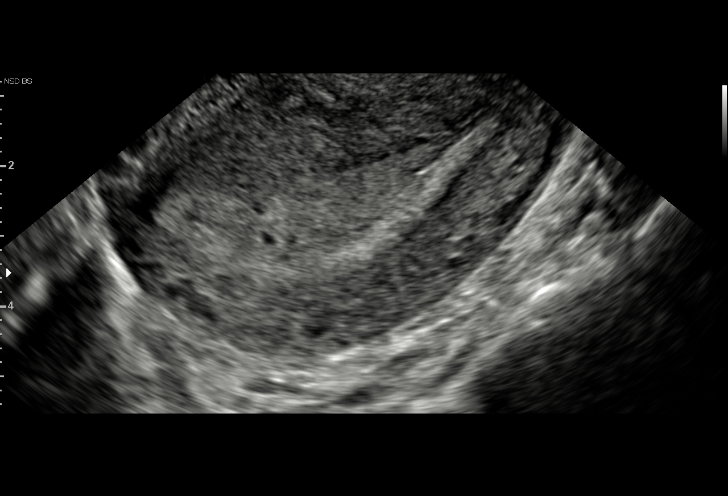

[15 of 28 positions shown; findings below may reference images not displayed]

FINDINGS: Intrauterine gestational sac: Possible early.

Yolk sac:  Not Visualized.

Embryo:  Not Visualized.

Cardiac Activity: Not Visualized.

MSD: 2 mm, too small to calculate gestational age

Subchorionic hemorrhage:  None visualized.

Maternal uterus/adnexae: Suggestion of tiny early intrauterine
gestational sac in the endometrial canal. Possible corpus luteal
cyst in the left ovary, normal ovarian blood flow. Right ovary is
normal. Small amount of simple free fluid in the cul-de-sac.
IMPRESSION: Probable early intrauterine gestational sac, but no yolk sac, fetal
pole, or cardiac activity yet visualized. Recommend follow-up
quantitative B-HCG levels and follow-up US in 14 days to assess
viability. This recommendation follows SRU consensus guidelines:
Diagnostic Criteria for Nonviable Pregnancy Early in the First
Trimester. N Engl J Med 0257; [DATE].

## 2021-12-16 ENCOUNTER — Other Ambulatory Visit: Payer: Self-pay

## 2021-12-16 ENCOUNTER — Encounter (HOSPITAL_COMMUNITY): Payer: Self-pay

## 2021-12-16 ENCOUNTER — Inpatient Hospital Stay (HOSPITAL_COMMUNITY): Payer: Medicaid Other

## 2021-12-16 ENCOUNTER — Inpatient Hospital Stay (HOSPITAL_COMMUNITY)
Admission: AD | Admit: 2021-12-16 | Discharge: 2021-12-16 | Disposition: A | Discharge status: Home / Self Care | Payer: Medicaid Other | Attending: Family Medicine | Admitting: Family Medicine

## 2021-12-16 DIAGNOSIS — R1031 Right lower quadrant pain: Secondary | ICD-10-CM

## 2021-12-16 DIAGNOSIS — N83201 Unspecified ovarian cyst, right side: Secondary | ICD-10-CM | POA: Insufficient documentation

## 2021-12-16 LAB — CBC
HCT: 40.9 % (ref 36.0–46.0)
Hemoglobin: 13.4 g/dL (ref 12.0–15.0)
MCH: 30.2 pg (ref 26.0–34.0)
MCHC: 32.8 g/dL (ref 30.0–36.0)
MCV: 92.1 fL (ref 80.0–100.0)
Platelets: 368 10*3/uL (ref 150–400)
RBC: 4.44 MIL/uL (ref 3.87–5.11)
RDW: 12.9 % (ref 11.5–15.5)
WBC: 7.5 10*3/uL (ref 4.0–10.5)
nRBC: 0 % (ref 0.0–0.2)

## 2021-12-16 LAB — WET PREP, GENITAL
Clue Cells Wet Prep HPF POC: NONE SEEN
Sperm: NONE SEEN
Trich, Wet Prep: NONE SEEN
WBC, Wet Prep HPF POC: >=10 — AB (ref <10)
Yeast Wet Prep HPF POC: NONE SEEN

## 2021-12-16 LAB — POCT PREGNANCY, URINE: Preg Test, Ur: NEGATIVE

## 2021-12-16 LAB — HCG, QUANTITATIVE, PREGNANCY: hCG, Beta Chain, Quant, S: 4 m[IU]/mL (ref <5)

## 2021-12-16 MED ORDER — IBUPROFEN 600 MG PO TABS: 600.0000 mg | ORAL_TABLET | Freq: Four times a day (QID) | ORAL | 3 refills | Status: DC | PRN

## 2021-12-16 NOTE — Progress Notes (Signed)
GC/Chlamydia & Wet prep cultures collected.

## 2021-12-16 NOTE — MAU Note (Signed)
Sarah Quinn is a 25 y.o. here in MAU reporting: had a TAB on 11/29. Since Friday has been having intermittent sharp right sided pain. Saw a little bleeding when she wiped.   Onset of complaint: ongoing  Pain score: 6/10  Vitals:   12/16/21 1059  BP: 120/77  Pulse: 90  Resp: 16  Temp: 98.4 F (36.9 C)  SpO2: 98%     Lab orders placed from triage: upt

## 2021-12-16 NOTE — MAU Provider Note (Signed)
History     CSN: [id number]  Arrival date and time: 12/16/21 1039   Event Date/Time   First Provider Initiated Contact with Patient 12/16/21 1114      Chief Complaint  Patient presents with   Abdominal Pain   Vaginal Bleeding   HPI This is a 25 year old G6 P3-0-3-3 who is seen for sharp left-sided abdominal pain that is intermittent and comes in waves and last for a few seconds at a time.  She has several of these every minute and they come fairly frequently.  Her pain started couple days ago.  No radiation to the pain.  No palliating or provoking factors.  She recently had a IAB on 11/29.  She bled for a few days, then stopped.  She denies intercourse since her procedure.  She denies fevers, chills, nausea, vomiting, diarrhea, constipation, dysuria.  No abnormal vaginal discharge.  She did start having spotting this morning when she wiped.  OB History     Gravida  6   Para  3   Term  3   Preterm  0   AB  3   Living  3      SAB  1   IAB  2   Ectopic  0   Multiple  0   Live Births  3           Past Medical History:  Diagnosis Date   Hx of chlamydia infection    Hx of gonorrhea    Syncope     Past Surgical History:  Procedure Laterality Date   INDUCED ABORTION     NO PAST SURGERIES      Family History  Problem Relation Age of Onset   Healthy Mother    Healthy Father     Social History   Tobacco Use   Smoking status: Never   Smokeless tobacco: Never  Vaping Use   Vaping Use: Never used  Substance Use Topics   Alcohol use: No   Drug use: Not Currently    Types: Marijuana    Comment: last smoked January 2020    Allergies: No Known Allergies  Medications Prior to Admission  Medication Sig Dispense Refill Last Dose   ibuprofen (ADVIL) 800 MG tablet Take 1 tablet (800 mg total) by mouth every 8 (eight) hours. 30 tablet 0    Prenatal Vit-Fe Fumarate-FA (PRENATAL COMPLETE) 14-0.4 MG TABS Take 2 tablets by mouth daily. 60 each 0      Review of Systems Physical Exam   Blood pressure 109/63, pulse 70, temperature 98.4 F (36.9 C), temperature source Oral, resp. rate 16, SpO2 98 %, unknown if currently breastfeeding.  Physical Exam Vitals reviewed.  Constitutional:      Appearance: She is well-developed.  HENT:     Head: Normocephalic and atraumatic.  Cardiovascular:     Rate and Rhythm: Normal rate and regular rhythm.  Pulmonary:     Effort: Pulmonary effort is normal.  Abdominal:     General: Abdomen is flat.     Palpations: Abdomen is soft.     Tenderness: There is abdominal tenderness in the right lower quadrant. There is no right CVA tenderness, left CVA tenderness, guarding or rebound. Negative signs include Murphy's sign.  Skin:    General: Skin is warm and dry.     Capillary Refill: Capillary refill takes less than 2 seconds.  Neurological:     General: No focal deficit present.     Mental Status: She is alert.  Results for orders placed or performed during the hospital encounter of 12/16/21 (from the past 24 hour(s))  Pregnancy, urine POC     Status: None   Collection Time: 12/16/21 10:54 AM  Result Value Ref Range   Preg Test, Ur NEGATIVE NEGATIVE  CBC     Status: None   Collection Time: 12/16/21 11:38 AM  Result Value Ref Range   WBC 7.5 4.0 - 10.5 K/uL   RBC 4.44 3.87 - 5.11 MIL/uL   Hemoglobin 13.4 12.0 - 15.0 g/dL   HCT 75.1 70.0 - 17.4 %   MCV 92.1 80.0 - 100.0 fL   MCH 30.2 26.0 - 34.0 pg   MCHC 32.8 30.0 - 36.0 g/dL   RDW 94.4 96.7 - 59.1 %   Platelets 368 150 - 400 K/uL   nRBC 0.0 0.0 - 0.2 %  hCG, quantitative, pregnancy     Status: None   Collection Time: 12/16/21 11:38 AM  Result Value Ref Range   hCG, Beta Chain, Quant, S 4 <5 mIU/mL  Wet prep, genital     Status: Abnormal   Collection Time: 12/16/21 11:55 AM   Specimen: Vaginal; Genital  Result Value Ref Range   Yeast Wet Prep HPF POC NONE SEEN NONE SEEN   Trich, Wet Prep NONE SEEN NONE SEEN   Clue Cells Wet Prep  HPF POC NONE SEEN NONE SEEN   WBC, Wet Prep HPF POC >=10 (A) <10   Sperm NONE SEEN    US PELVIC COMPLETE WITH TRANSVAGINAL  Result Date: 12/16/2021 CLINICAL DATA:  Lower abdominal pain EXAM: TRANSABDOMINAL AND TRANSVAGINAL ULTRASOUND OF PELVIS TECHNIQUE: Both transabdominal and transvaginal ultrasound examinations of the pelvis were performed. Transabdominal technique was performed for global imaging of the pelvis including uterus, ovaries, adnexal regions, and pelvic cul-de-sac. It was necessary to proceed with endovaginal exam following the transabdominal exam to visualize the endometrium. COMPARISON:  None FINDINGS: Uterus Measure the ments: Thickened 8.7 x 4.0 x 4.1 = volume: 75 mL. No fibroids or other mass visualized. Endometrium Thickness: 10 mm.  No focal abnormality visualized. Right ovary Measurements: 3.3 x 2.3 x 3.3 = volume: 12.5 mL. Normal appearance/no adnexal mass. Simple cyst measuring up to 2.1 cm. Left ovary Measurements: 2.6 x 1.5 x 1.5 = volume: 3.1 mL. Normal appearance/no adnexal mass. Other findings No abnormal free fluid. IMPRESSION: Normal examination. Electronically Signed   By: Larose Hires D.O.   On: 12/16/2021 13:57    MAU Course  Procedures Imaging: I independent reviewed the images of the [place]. No intrauterine findings - Endometrium 1cm. Right simple cyst 2.1cm. Left ovary normal.  MDM   Assessment and Plan   1. Right ovarian cyst   2. Right lower quadrant abdominal pain    Discussed [place] results. Start ibuprofen 600mg  every 6 hours. F/u with increasing pain, nausea, fever, etc.  [facility] 12/16/2021, 11:47 AM

## 2021-12-17 LAB — GC/CHLAMYDIA PROBE AMP (~~LOC~~) NOT AT ARMC
Chlamydia: NEGATIVE
Comment: NEGATIVE
Comment: NORMAL
Neisseria Gonorrhea: NEGATIVE

## 2022-09-23 DIAGNOSIS — R030 Elevated blood-pressure reading, without diagnosis of hypertension: Secondary | ICD-10-CM | POA: Diagnosis not present

## 2022-09-23 DIAGNOSIS — Z0000 Encounter for general adult medical examination without abnormal findings: Secondary | ICD-10-CM | POA: Diagnosis not present

## 2022-09-23 DIAGNOSIS — N921 Excessive and frequent menstruation with irregular cycle: Secondary | ICD-10-CM | POA: Diagnosis not present

## 2022-09-23 DIAGNOSIS — Z124 Encounter for screening for malignant neoplasm of cervix: Secondary | ICD-10-CM | POA: Diagnosis not present

## 2022-09-23 DIAGNOSIS — Z113 Encounter for screening for infections with a predominantly sexual mode of transmission: Secondary | ICD-10-CM | POA: Diagnosis not present

## 2022-09-23 DIAGNOSIS — Z3046 Encounter for surveillance of implantable subdermal contraceptive: Secondary | ICD-10-CM | POA: Diagnosis not present

## 2022-11-28 DIAGNOSIS — Z419 Encounter for procedure for purposes other than remedying health state, unspecified: Secondary | ICD-10-CM | POA: Diagnosis not present

## 2022-12-29 DIAGNOSIS — Z419 Encounter for procedure for purposes other than remedying health state, unspecified: Secondary | ICD-10-CM | POA: Diagnosis not present

## 2023-01-02 DIAGNOSIS — G8929 Other chronic pain: Secondary | ICD-10-CM | POA: Diagnosis not present

## 2023-01-02 DIAGNOSIS — R519 Headache, unspecified: Secondary | ICD-10-CM | POA: Diagnosis not present

## 2023-01-09 ENCOUNTER — Telehealth: Payer: Self-pay

## 2023-01-09 NOTE — Telephone Encounter (Signed)
Mychart msg sent. AS, CMA 

## 2023-01-29 DIAGNOSIS — Z419 Encounter for procedure for purposes other than remedying health state, unspecified: Secondary | ICD-10-CM | POA: Diagnosis not present

## 2023-02-03 IMAGING — US US PELVIS COMPLETE WITH TRANSVAGINAL
1 series · 15 of 25 positions shown · non-contrast
Comparison: None

CLINICAL DATA: Lower abdominal pain



[Series 1: us pelvis complete with transvaginal · 15 of 42 slices shown]
[im 1/42]
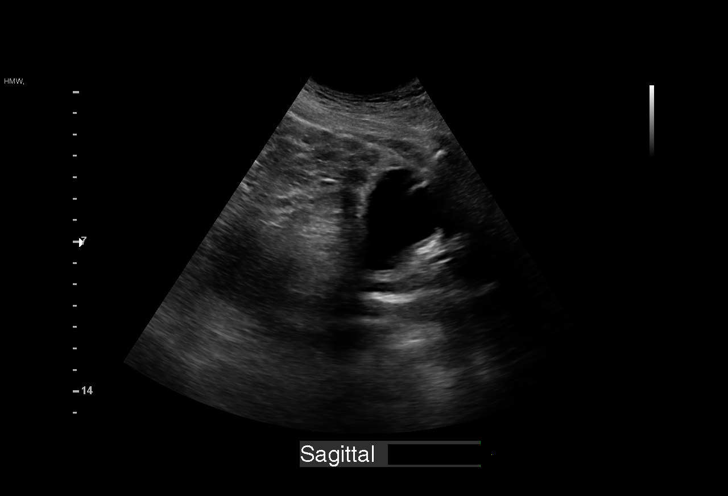
[im 4/42]
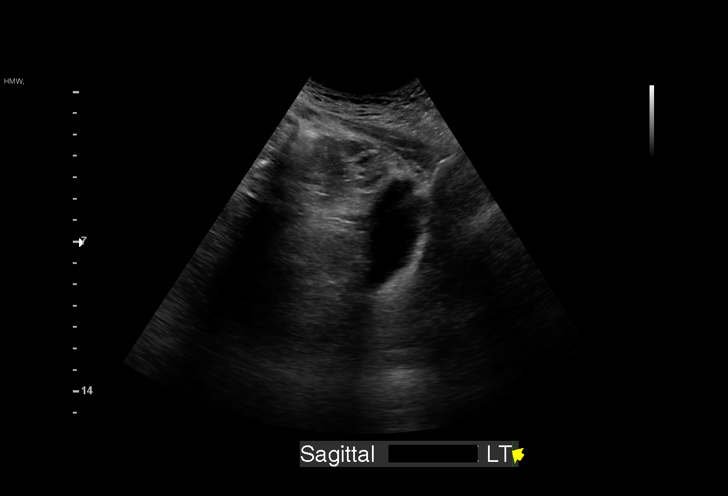
[im 7/42]
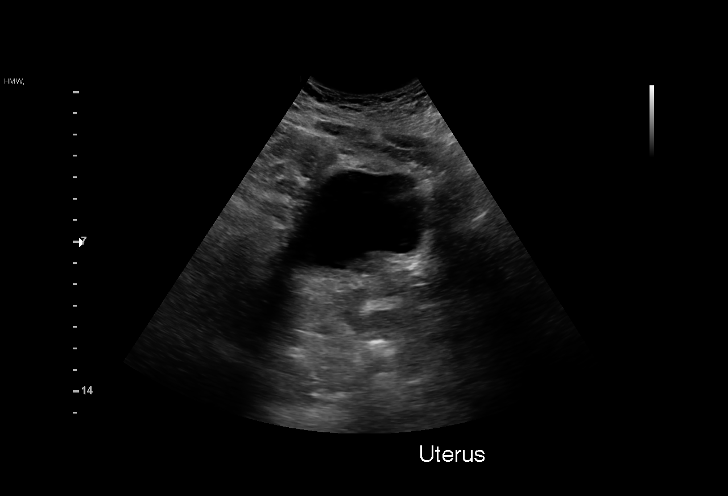
[im 9/42]
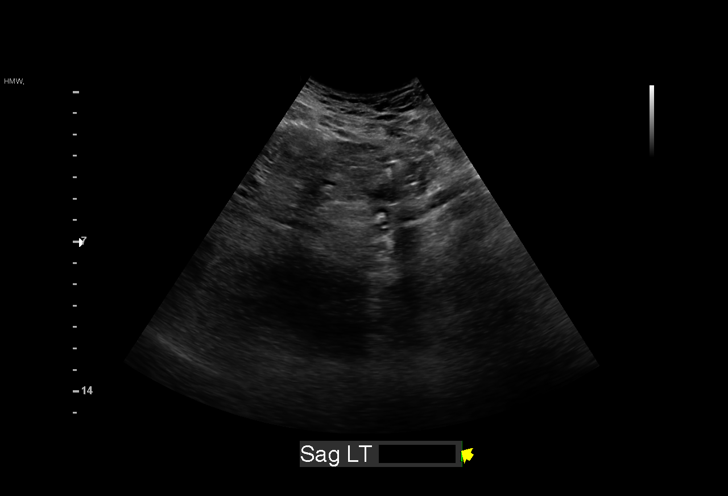
[im 12/42]
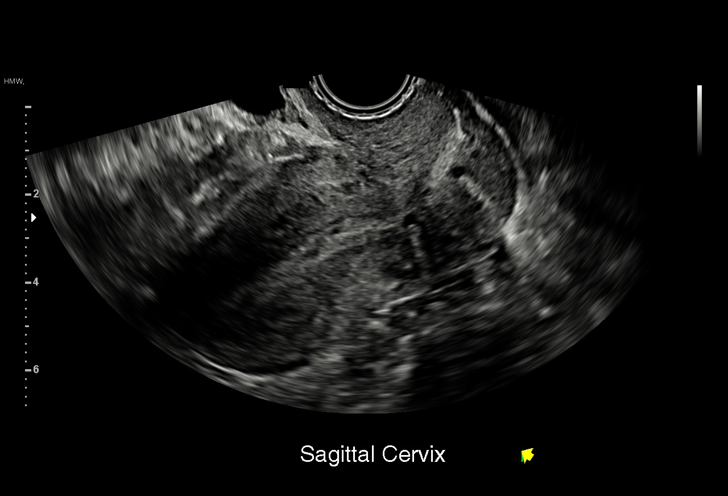
[im 16/42]
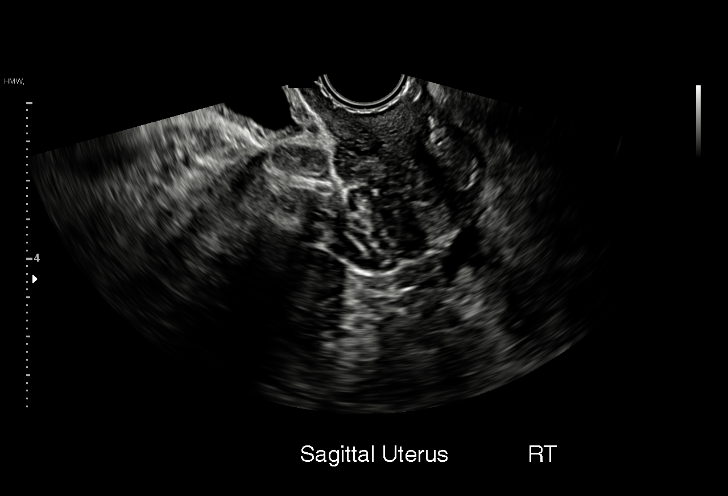
[im 18/42]
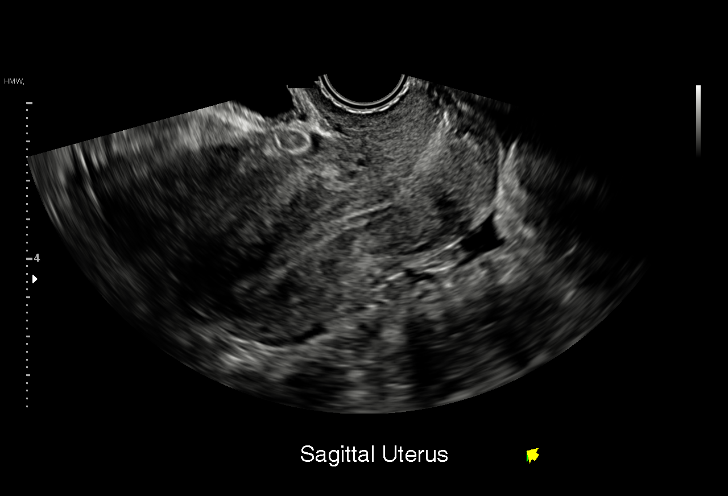
[im 21/42]
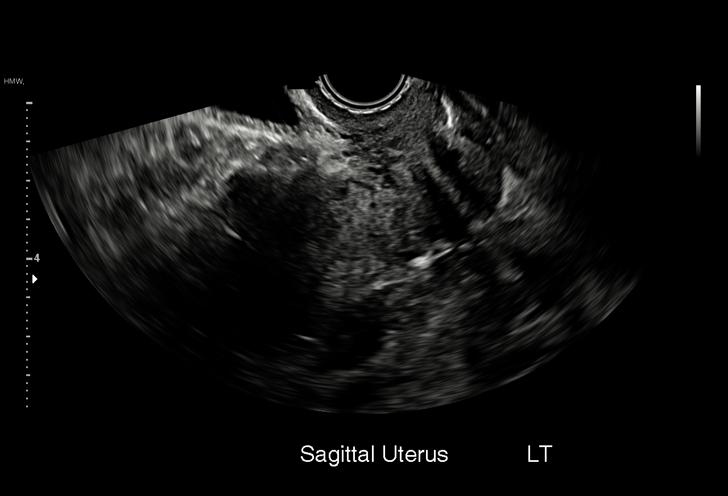
[im 24/42]
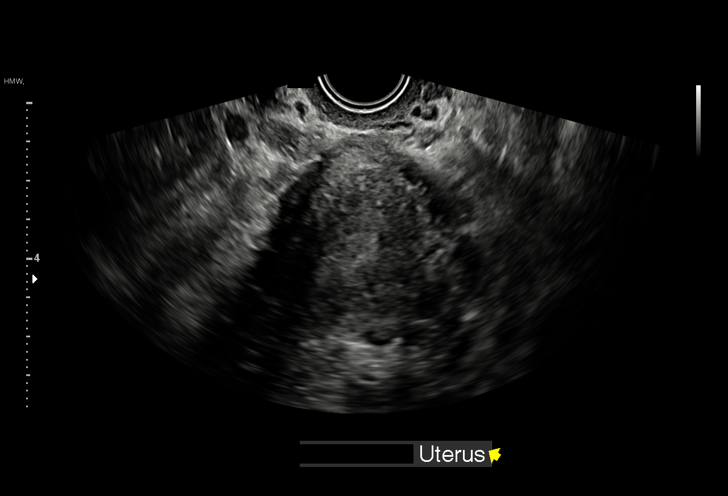
[im 26/42]
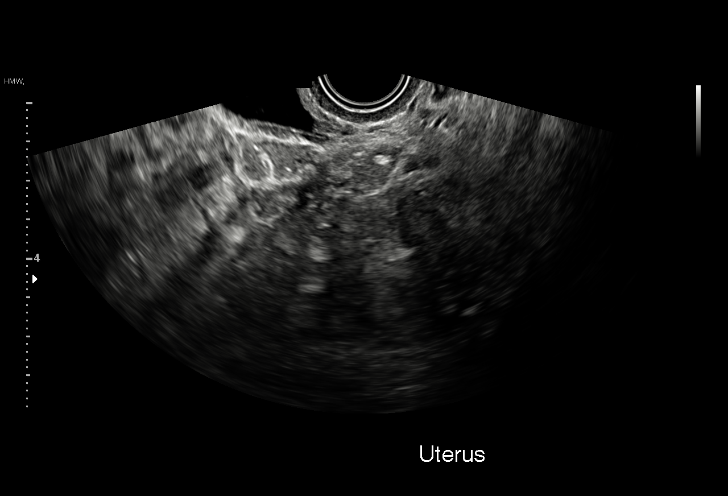
[im 30/42]
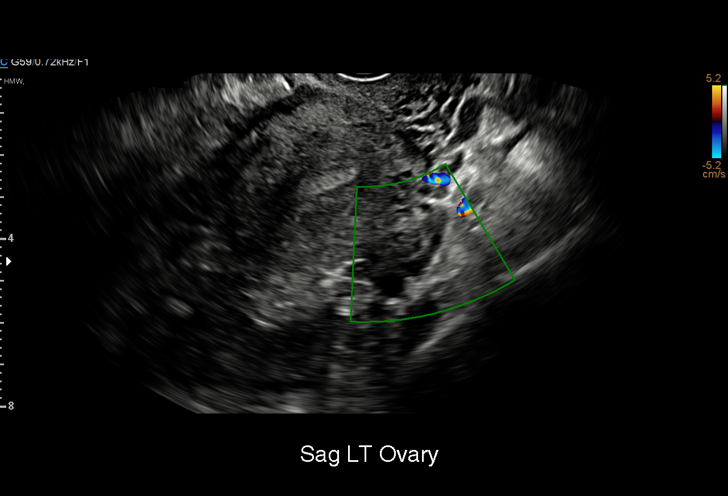
[im 33/42]
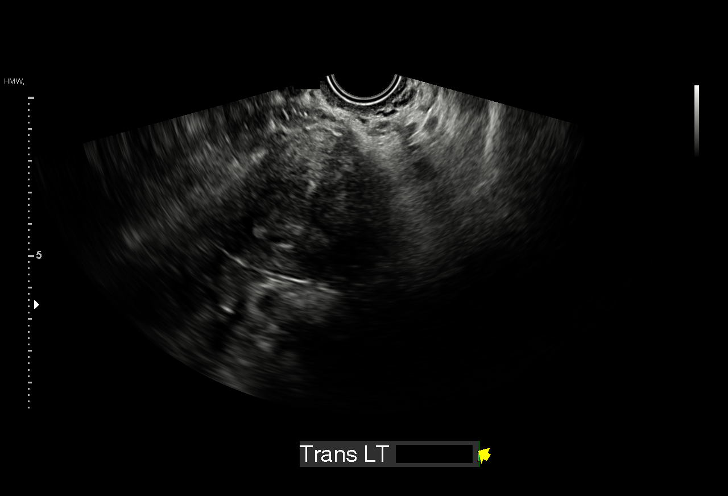
[im 35/42]
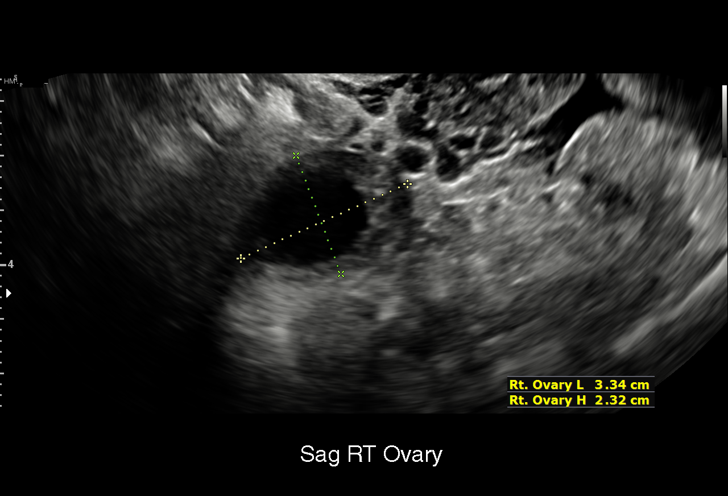
[im 38/42]
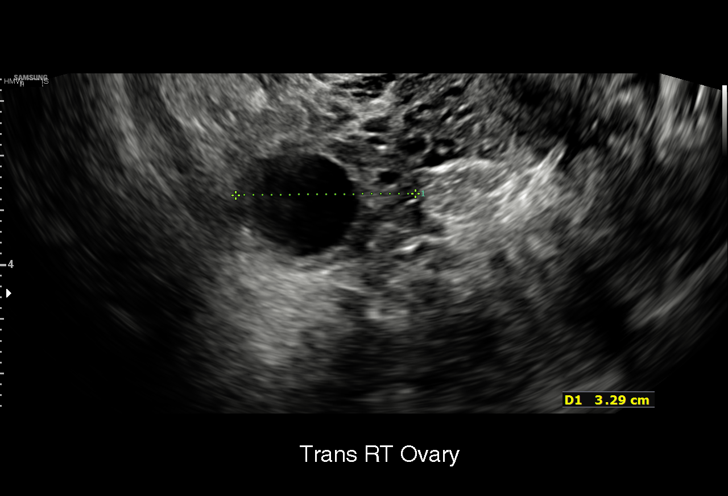
[im 42/42]
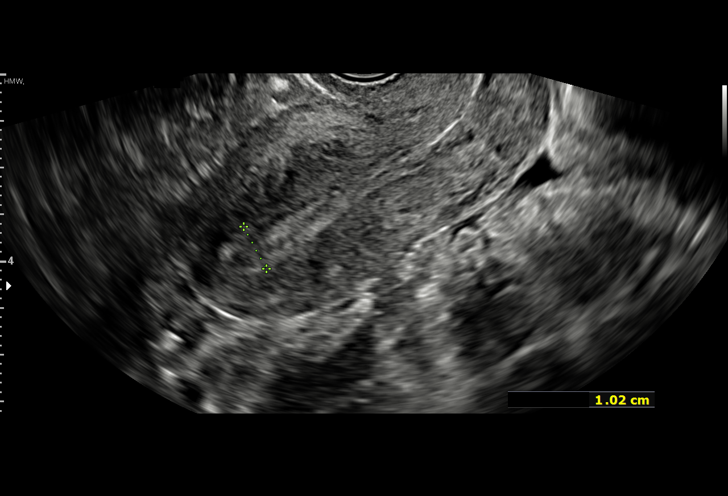

[15 of 25 positions shown; findings below may reference images not displayed]

FINDINGS: Uterus

Measure the ments: Thickened 8.7 x 4.0 x 4.1 = volume: 75 mL. No
fibroids or other mass visualized.

Endometrium

Thickness: 10 mm.  No focal abnormality visualized.

Right ovary

Measurements: 3.3 x 2.3 x 3.3 = volume: 12.5 mL. Normal
appearance/no adnexal mass. Simple cyst measuring up to 2.1 cm.

Left ovary

Measurements: 2.6 x 1.5 x 1.5 = volume: 3.1 mL. Normal appearance/no
adnexal mass.

Other findings

No abnormal free fluid.
IMPRESSION: Normal examination.

## 2023-02-27 DIAGNOSIS — Z419 Encounter for procedure for purposes other than remedying health state, unspecified: Secondary | ICD-10-CM | POA: Diagnosis not present

## 2023-03-30 DIAGNOSIS — Z419 Encounter for procedure for purposes other than remedying health state, unspecified: Secondary | ICD-10-CM | POA: Diagnosis not present

## 2023-04-29 DIAGNOSIS — Z419 Encounter for procedure for purposes other than remedying health state, unspecified: Secondary | ICD-10-CM | POA: Diagnosis not present

## 2023-05-30 DIAGNOSIS — Z419 Encounter for procedure for purposes other than remedying health state, unspecified: Secondary | ICD-10-CM | POA: Diagnosis not present

## 2023-06-22 ENCOUNTER — Emergency Department (HOSPITAL_COMMUNITY)
Admission: EM | Admit: 2023-06-22 | Discharge: 2023-06-22 | Disposition: A | Discharge status: Home / Self Care | Payer: Medicaid Other | Attending: Emergency Medicine | Admitting: Emergency Medicine

## 2023-06-22 ENCOUNTER — Encounter (HOSPITAL_COMMUNITY): Payer: Self-pay | Admitting: Emergency Medicine

## 2023-06-22 ENCOUNTER — Other Ambulatory Visit: Payer: Self-pay

## 2023-06-22 DIAGNOSIS — K0889 Other specified disorders of teeth and supporting structures: Secondary | ICD-10-CM | POA: Diagnosis not present

## 2023-06-22 DIAGNOSIS — K029 Dental caries, unspecified: Secondary | ICD-10-CM | POA: Insufficient documentation

## 2023-06-22 MED ORDER — ACETAMINOPHEN 500 MG PO TABS
1000.0000 mg | ORAL_TABLET | Freq: Once | ORAL | Status: AC
  Administered 2023-06-22: 1000 mg via ORAL
  Filled 2023-06-22: qty 2

## 2023-06-22 MED ORDER — KETOROLAC TROMETHAMINE 15 MG/ML IJ SOLN
15.0000 mg | Freq: Once | INTRAMUSCULAR | Status: AC
  Administered 2023-06-22: 15 mg via INTRAMUSCULAR
  Filled 2023-06-22: qty 1

## 2023-06-22 MED ORDER — AMOXICILLIN-POT CLAVULANATE 875-125 MG PO TABS: 1.0000 | ORAL_TABLET | Freq: Two times a day (BID) | ORAL | 0 refills | Status: AC

## 2023-06-22 MED ORDER — AMOXICILLIN-POT CLAVULANATE 875-125 MG PO TABS
1.0000 | ORAL_TABLET | Freq: Once | ORAL | Status: AC
  Administered 2023-06-22: 1 via ORAL
  Filled 2023-06-22: qty 1

## 2023-06-22 MED ORDER — IBUPROFEN 600 MG PO TABS
600.0000 mg | ORAL_TABLET | Freq: Three times a day (TID) | ORAL | 0 refills | Status: AC | PRN
Start: 2023-06-22 — End: 2023-06-25

## 2023-06-22 NOTE — ED Triage Notes (Signed)
C/o L upper dental pain x 1 week.  Took ibuprofen this morning without relief.

## 2023-06-22 NOTE — Discharge Instructions (Addendum)
Thank you for letting us take care of you today.  We gave you your first dose of antibiotic and a pain shot in the ED today. Please start prescribed antibiotics tomorrow. I provided information for our dentist on-call. Call their office to schedule an appointment within the next 48 hours. Tell them you were seen at [facility] ED and referred to their dentist.  I recommend establishing primary care as well. I provided 2 clinics you may schedule an appointment with or you may go to a PCP of your own choosing. It is good to have a primary if any health concerns come up or to manage any chronic conditions.   For any new or worsening condition including fever, difficulty breathing or swallowing, chest pain, or other new, concerning symptoms, return to nearest ED for re-evaluation.

## 2023-06-22 NOTE — ED Provider Notes (Signed)
[place] EMERGENCY DEPARTMENT AT [facility] Provider Note   CSN: [id number] Arrival date & time: 06/22/23  1224     History  Chief Complaint  Patient presents with   Dental Pain    Sarah Quinn is a 27 y.o. female presents to the ED complaining of left upper dental pain for the past week.  She states that she injured the tooth about 2 years ago but has never had issues with it until recently.  She attempted to contact the dentist but was unable to get an appointment.  She denies associated fever, chills, nausea, vomiting, trouble breathing or swallowing, or other concerns today. She denies chance of pregnancy.       Home Medications Prior to Admission medications   Medication Sig Start Date End Date Taking? Authorizing Provider  amoxicillin-clavulanate (AUGMENTIN) 875-125 MG tablet Take 1 tablet by mouth every 12 (twelve) hours for 7 days. 06/23/23 06/30/23 Yes Brytni Dray L, PA-C  ibuprofen (ADVIL) 600 MG tablet Take 1 tablet (600 mg total) by mouth every 8 (eight) hours as needed for up to 3 days for mild pain or moderate pain. 06/22/23 06/25/23 Yes Xoe Hoe L, PA-C  Prenatal Vit-Fe Fumarate-FA (PRENATAL COMPLETE) 14-0.4 MG TABS Take 2 tablets by mouth daily. 12/24/18   Muthersbaugh, Dahlia Client, PA-C      Allergies    Patient has no known allergies.    Review of Systems   Review of Systems  All other systems reviewed and are negative.   Physical Exam Updated Vital Signs BP 114/83 (BP Location: Right Arm)   Pulse 65   Temp 98.6 F (37 C) (Oral)   Resp 18   Ht 5\' 6"  (1.676 m)   Wt 85.3 kg   LMP 06/11/2023   SpO2 100%   BMI 30.34 kg/m  Physical Exam Vitals and nursing note reviewed.  Constitutional:      General: She is not in acute distress.    Appearance: Normal appearance.  HENT:     Head: Normocephalic and atraumatic.     Mouth/Throat:     Mouth: Mucous membranes are moist.      Comments: Significant dental caries to tooth noted above,  minimal surrounding gingival erythema, no palpable abscess, no submandibular, sublingual, submental tenderness, induration, or fluctuance, widely patent airway without signs of PTA or RPA Eyes:     Conjunctiva/sclera: Conjunctivae normal.  Cardiovascular:     Rate and Rhythm: Normal rate and regular rhythm.     Heart sounds: No murmur heard. Pulmonary:     Effort: Pulmonary effort is normal.     Breath sounds: Normal breath sounds.  Abdominal:     General: Abdomen is flat.     Palpations: Abdomen is soft.     Tenderness: There is no abdominal tenderness.  Musculoskeletal:        General: Normal range of motion.     Cervical back: Normal range of motion and neck supple. No rigidity.     Right lower leg: No edema.     Left lower leg: No edema.  Lymphadenopathy:     Cervical: No cervical adenopathy.  Skin:    General: Skin is warm and dry.     Capillary Refill: Capillary refill takes less than 2 seconds.  Neurological:     Mental Status: She is alert. Mental status is at baseline.  Psychiatric:        Behavior: Behavior normal.     ED Results / Procedures / Treatments  Labs (all labs ordered are listed, but only abnormal results are displayed) Labs Reviewed  PREGNANCY, URINE    EKG None  Radiology No results found.  Procedures Procedures    Medications Ordered in ED Medications  ketorolac (TORADOL) 15 MG/ML injection 15 mg (has no administration in time range)  amoxicillin-clavulanate (AUGMENTIN) 875-125 MG per tablet 1 tablet (has no administration in time range)  acetaminophen (TYLENOL) tablet 1,000 mg (1,000 mg Oral Given 06/22/23 1532)    ED Course/ Medical Decision Making/ A&P                             Medical Decision Making Risk Prescription drug management.   Medical Decision Making:   Sarah Quinn is a 27 y.o. female who presented to the ED today with dental pain detailed above.    Complete initial physical exam performed, notably the  patient was HDS in no acute distress. No obvious intraoral lesions or swelling. Poor dentition diffusely with large caries noted above. Reviewed and confirmed nursing documentation for past medical history, family history, social history.    Initial Assessment:   With the patient's presentation of dental pain, most likely diagnosis is reversible vs irreversible pulpitis. Other diagnoses were considered including (but not limited to) ludwig's angina, osteitis, dental abscess, PTA, RPA. These are considered less likely due to history of present illness and physical exam findings.   This is most consistent with an acute complicated illness  Initial Plan:  Symptomatic management with Toradol/NSAIDs at home Due to clinical overlap with potentially reversible pulpitis, antibiotics prescribed Patient will need definitive management with dentistry. Provided with on call dentistry for follow up  Disposition:  I have considered need for hospitalization, however, considering all of the above, I believe this patient is stable for discharge at this time.  Patient educated about specific return precautions for given chief complaint and symptoms.  Patient educated about follow-up with PCP and dentistry.    Patient expressed understanding of return precautions and need for follow-up. All education provided in verbal form with additional information in written form. Time was allowed for answering of patient questions. Patient discharged in stable condition.             Final Clinical Impression(s) / ED Diagnoses Final diagnoses:  Dentalgia    Rx / DC Orders ED Discharge Orders          Ordered    amoxicillin-clavulanate (AUGMENTIN) 875-125 MG tablet  Every 12 hours        06/22/23 1659    ibuprofen (ADVIL) 600 MG tablet  Every 8 hours PRN        06/22/23 1659              Tonette Lederer, PA-C 06/22/23 1705    Pricilla Loveless, MD 06/23/23 1612

## 2023-06-22 NOTE — ED Provider Triage Note (Signed)
Emergency Medicine Provider Triage Evaluation Note  Sarah Quinn , a 27 y.o. female  was evaluated in triage.  Pt complains of left upper dental pain for the past week. Does not have a dentist. Last saw one years ago. Denies any fevers or trouble swallowing.  Review of Systems  Positive:  Negative:   Physical Exam  BP 114/83 (BP Location: Right Arm)   Pulse 65   Temp 98.6 F (37 C) (Oral)   Resp 18   Ht 5\' 6"  (1.676 m)   Wt 85.3 kg   LMP 06/11/2023   SpO2 100%   BMI 30.34 kg/m  Gen:   Awake, no distress   Resp:  Normal effort  MSK:   Moves extremities without difficulty  Other:  Large hole in left upper molar  Medical Decision Making  Medically screening exam initiated at 3:21 PM.  Appropriate orders placed.  Sarah Quinn was informed that the remainder of the evaluation will be completed by another provider, this initial triage assessment does not replace that evaluation, and the importance of remaining in the ED until their evaluation is complete.     Achille Rich, [place] 06/22/23 1523

## 2023-06-29 DIAGNOSIS — Z419 Encounter for procedure for purposes other than remedying health state, unspecified: Secondary | ICD-10-CM | POA: Diagnosis not present

## 2023-07-30 DIAGNOSIS — Z419 Encounter for procedure for purposes other than remedying health state, unspecified: Secondary | ICD-10-CM | POA: Diagnosis not present
